# Patient Record
Sex: Male | Born: 1995 | Hispanic: No | Marital: Single | Smoking: Former smoker
Health system: Southern US, Community
[De-identification: ages and names within clinical notes are randomized; demographics above are authoritative.]

## PROBLEM LIST (undated history)

## (undated) DIAGNOSIS — I10 Essential (primary) hypertension: Secondary | ICD-10-CM

---
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
## 2015-01-28 ENCOUNTER — Encounter: Payer: Self-pay | Admitting: Emergency Medicine

## 2015-01-28 ENCOUNTER — Emergency Department
Admission: EM | Admit: 2015-01-28 | Discharge: 2015-01-28 | Disposition: A | Discharge status: Home / Self Care | Payer: Medicaid Other | Attending: Emergency Medicine | Admitting: Emergency Medicine

## 2015-01-28 DIAGNOSIS — I10 Essential (primary) hypertension: Secondary | ICD-10-CM | POA: Insufficient documentation

## 2015-01-28 DIAGNOSIS — R030 Elevated blood-pressure reading, without diagnosis of hypertension: Secondary | ICD-10-CM | POA: Diagnosis present

## 2015-01-28 MED ORDER — HYDROCHLOROTHIAZIDE 25 MG PO TABS: 25.0000 mg | ORAL_TABLET | Freq: Every day | ORAL | Status: DC

## 2015-01-28 NOTE — ED Provider Notes (Signed)
[facility] Emergency Department Provider Note     Time seen: ----------------------------------------- 2:42 PM on 01/28/2015 -----------------------------------------    I have reviewed the triage vital signs and the nursing notes.   HISTORY  Chief Complaint Hypertension    HPI Kenneth Cook is a 19 y.o. male sent here by his work after he noticed that his blood pressure was elevated. Patient was instructed to come here for minutes or an evaluation. Patient states he has mild hypertension but has never taken any blood pressure medicine for. He does not have any other symptoms.   History reviewed. No pertinent past medical history.  There are no active problems to display for this patient.   History reviewed. No pertinent past surgical history.  Allergies Review of patient's allergies indicates no known allergies.  Social History Social History  Substance Use Topics  . Smoking status: Never Smoker   . Smokeless tobacco: None  . Alcohol Use: No    Review of Systems Constitutional: Negative for fever. Eyes: Negative for visual changes. ENT: Negative for sore throat. Cardiovascular: Negative for chest pain. Respiratory: Negative for shortness of breath. Gastrointestinal: Negative for abdominal pain, vomiting and diarrhea. Genitourinary: Negative for dysuria. Musculoskeletal: Negative for back pain. Skin: Negative for rash. Neurological: Negative for headaches, focal weakness or numbness.  10-point ROS otherwise negative.  ____________________________________________   PHYSICAL EXAM:  VITAL SIGNS: ED Triage Vitals  Enc Vitals Group     BP 01/28/15 1427 160/107 mmHg     Pulse Rate 01/28/15 1427 92     Resp 01/28/15 1427 20     Temp 01/28/15 1427 98.3 F (36.8 C)     Temp Source 01/28/15 1427 Oral     SpO2 01/28/15 1427 99 %     Weight 01/28/15 1427 265 lb (120.203 kg)     Height 01/28/15 1427  (1.854 m)     Head  Cir --      Peak Flow --      Pain Score --      Pain Loc --      Pain Edu? --      Excl. in GC? --     Constitutional: Alert and oriented. Well appearing and in no distress. Eyes: Conjunctivae are normal. PERRL. Normal extraocular movements. ENT   Head: Normocephalic and atraumatic.   Nose: No congestion/rhinnorhea.   Mouth/Throat: Mucous membranes are moist.   Neck: No stridor. Cardiovascular: Normal rate, regular rhythm. Normal and symmetric distal pulses are present in all extremities. No murmurs, rubs, or gallops. Respiratory: Normal respiratory effort without tachypnea nor retractions. Breath sounds are clear and equal bilaterally. No wheezes/rales/rhonchi. Gastrointestinal: Soft and nontender. No distention. No abdominal bruits.  Musculoskeletal: Nontender with normal range of motion in all extremities. No joint effusions.  No lower extremity tenderness nor edema. Neurologic:  Normal speech and language. No gross focal neurologic deficits are appreciated. Speech is normal. No gait instability. Skin:  Skin is warm, dry and intact. No rash noted. Psychiatric: Mood and affect are normal. Speech and behavior are normal. Patient exhibits appropriate insight and judgment. ____________________________________________  ED COURSE:  Pertinent labs & imaging results that were available during my care of the patient were reviewed by me and considered in my medical decision making (see chart for details). Patient with a symptomatically hypertension, advised that his blood pressures been elevated for years. ____________________________________________  FINAL ASSESSMENT AND PLAN  Hypertension  Plan: Patient with labs and imaging as dictated above. Patient be  started on HCTZ, is encouraged to have close follow-up with [facility] for reevaluation.   Emily Filbert, MD   Emily Filbert, MD 01/28/15 [phone]

## 2015-01-28 NOTE — ED Notes (Signed)
States he went to work screening.they noticed that his b/p was elevated. Was instructed to come here for meds or eval. States he has had mild htn but has never taken any meds. Denies any other sx's

## 2015-01-28 NOTE — ED Notes (Signed)
Patient sent from employee health/ODS due to elevated blood pressure and need for treatment prior to being able to gain employment. Patient denies any associated symptoms or complaints.

## 2015-01-28 NOTE — Discharge Instructions (Signed)

## 2015-06-14 ENCOUNTER — Encounter: Payer: Self-pay | Admitting: Emergency Medicine

## 2015-06-14 ENCOUNTER — Emergency Department
Admission: EM | Admit: 2015-06-14 | Discharge: 2015-06-14 | Disposition: A | Discharge status: Home / Self Care | Payer: Medicaid Other | Attending: Emergency Medicine | Admitting: Emergency Medicine

## 2015-06-14 ENCOUNTER — Emergency Department: Payer: Medicaid Other

## 2015-06-14 DIAGNOSIS — R112 Nausea with vomiting, unspecified: Secondary | ICD-10-CM | POA: Insufficient documentation

## 2015-06-14 DIAGNOSIS — I10 Essential (primary) hypertension: Secondary | ICD-10-CM | POA: Insufficient documentation

## 2015-06-14 DIAGNOSIS — R0981 Nasal congestion: Secondary | ICD-10-CM | POA: Insufficient documentation

## 2015-06-14 HISTORY — DX: Essential (primary) hypertension: I10

## 2015-06-14 LAB — URINALYSIS COMPLETE WITH MICROSCOPIC (ARMC ONLY)
Bilirubin Urine: NEGATIVE
GLUCOSE, UA: NEGATIVE mg/dL
Hgb urine dipstick: NEGATIVE
KETONES UR: NEGATIVE mg/dL
Leukocytes, UA: NEGATIVE
NITRITE: NEGATIVE
Protein, ur: NEGATIVE mg/dL
RBC / HPF: NONE SEEN RBC/hpf (ref 0–5)
SPECIFIC GRAVITY, URINE: 1.018 (ref 1.005–1.030)
WBC, UA: NONE SEEN WBC/hpf (ref 0–5)
pH: 7 (ref 5.0–8.0)

## 2015-06-14 LAB — BASIC METABOLIC PANEL
Anion gap: 10 (ref 5–15)
BUN: 12 mg/dL (ref 6–20)
CALCIUM: 9.6 mg/dL (ref 8.9–10.3)
CHLORIDE: 104 mmol/L (ref 101–111)
CO2: 26 mmol/L (ref 22–32)
CREATININE: 1.11 mg/dL (ref 0.61–1.24)
Glucose, Bld: 98 mg/dL (ref 65–99)
Potassium: 3.6 mmol/L (ref 3.5–5.1)
SODIUM: 140 mmol/L (ref 135–145)

## 2015-06-14 MED ORDER — LISINOPRIL-HYDROCHLOROTHIAZIDE 20-25 MG PO TABS: 1.0000 | ORAL_TABLET | Freq: Every day | ORAL | Status: DC

## 2015-06-14 MED ORDER — HYDROCHLOROTHIAZIDE 25 MG PO TABS
25.0000 mg | ORAL_TABLET | Freq: Every day | ORAL | Status: DC
  Administered 2015-06-14: 25 mg via ORAL
  Filled 2015-06-14: qty 1

## 2015-06-14 MED ORDER — LISINOPRIL 10 MG PO TABS
20.0000 mg | ORAL_TABLET | Freq: Once | ORAL | Status: AC
  Administered 2015-06-14: 20 mg via ORAL
  Filled 2015-06-14: qty 2

## 2015-06-14 NOTE — ED Notes (Signed)
Pt called for treatment room x2, no answer in the lobby. First nurse notified.

## 2015-06-14 NOTE — Discharge Instructions (Signed)
Hypertension Hypertension, commonly called high blood pressure, is when the force of blood pumping through your arteries is too strong. Your arteries are the blood vessels that carry blood from your heart throughout your body. A blood pressure reading consists of a higher number over a lower number, such as 110/72. The higher number (systolic) is the pressure inside your arteries when your heart pumps. The lower number (diastolic) is the pressure inside your arteries when your heart relaxes. Ideally you want your blood pressure below 120/80. Hypertension forces your heart to work harder to pump blood. Your arteries may become narrow or stiff. Having untreated or uncontrolled hypertension can cause heart attack, stroke, kidney disease, and other problems. RISK FACTORS Some risk factors for high blood pressure are controllable. Others are not.  Risk factors you cannot control include:   Race. You may be at higher risk if you are African American.  Age. Risk increases with age.  Gender. Men are at higher risk than women before age 45 years. After age 65, women are at higher risk than men. Risk factors you can control include:  Not getting enough exercise or physical activity.  Being overweight.  Getting too much fat, sugar, calories, or salt in your diet.  Drinking too much alcohol. SIGNS AND SYMPTOMS Hypertension does not usually cause signs or symptoms. Extremely high blood pressure (hypertensive crisis) may cause headache, anxiety, shortness of breath, and nosebleed. DIAGNOSIS To check if you have hypertension, your health care provider will measure your blood pressure while you are seated, with your arm held at the level of your heart. It should be measured at least twice using the same arm. Certain conditions can cause a difference in blood pressure between your right and left arms. A blood pressure reading that is higher than normal on one occasion does not mean that you need treatment. If  it is not clear whether you have high blood pressure, you may be asked to return on a different day to have your blood pressure checked again. Or, you may be asked to monitor your blood pressure at home for 1 or more weeks. TREATMENT Treating high blood pressure includes making lifestyle changes and possibly taking medicine. Living a healthy lifestyle can help lower high blood pressure. You may need to change some of your habits. Lifestyle changes may include:  Following the DASH diet. This diet is high in fruits, vegetables, and whole grains. It is low in salt, red meat, and added sugars.  Keep your sodium intake below 2,300 mg per day.  Getting at least 30-45 minutes of aerobic exercise at least 4 times per week.  Losing weight if necessary.  Not smoking.  Limiting alcoholic beverages.  Learning ways to reduce stress. Your health care provider may prescribe medicine if lifestyle changes are not enough to get your blood pressure under control, and if one of the following is true:  You are 18-59 years of age and your systolic blood pressure is above 140.  You are 60 years of age or older, and your systolic blood pressure is above 150.  Your diastolic blood pressure is above 90.  You have diabetes, and your systolic blood pressure is over 140 or your diastolic blood pressure is over 90.  You have kidney disease and your blood pressure is above 140/90.  You have heart disease and your blood pressure is above 140/90. Your personal target blood pressure may vary depending on your medical conditions, your age, and other factors. HOME CARE INSTRUCTIONS    Have your blood pressure rechecked as directed by your health care provider.   Take medicines only as directed by your health care provider. Follow the directions carefully. Blood pressure medicines must be taken as prescribed. The medicine does not work as well when you skip doses. Skipping doses also puts you at risk for  problems.  Do not smoke.   Monitor your blood pressure at home as directed by your health care provider. SEEK MEDICAL CARE IF:   You think you are having a reaction to medicines taken.  You have recurrent headaches or feel dizzy.  You have swelling in your ankles.  You have trouble with your vision. SEEK IMMEDIATE MEDICAL CARE IF:  You develop a severe headache or confusion.  You have unusual weakness, numbness, or feel faint.  You have severe chest or abdominal pain.  You vomit repeatedly.  You have trouble breathing. MAKE SURE YOU:   Understand these instructions.  Will watch your condition.  Will get help right away if you are not doing well or get worse.   This information is not intended to replace advice given to you by your health care provider. Make sure you discuss any questions you have with your health care provider.   Document Released: 05/10/2005 Document Revised: 09/24/2014 Document Reviewed: 03/02/2013 Elsevier Interactive Patient Education 2016 [employer]   Take blood pressure medicine as directed. Follow-up with a primary physician as soon as you are able. Return to the emergency room for any concerns.

## 2015-06-14 NOTE — ED Notes (Addendum)
Pt c/o bp being elevated his whole life.  Has prescriptions but has not been able to fill them r/t financial difficulty.  For past 10 days has been elevated and having headaches.  Has been feeling weak as well.  Has been fatigued.

## 2015-06-14 NOTE — ED Provider Notes (Signed)
[facility] Emergency Department Provider Note  ____________________________________________  Time seen: Approximately 6:37 PM  I have reviewed the triage vital signs and the nursing notes.   HISTORY  Chief Complaint Hypertension    HPI Kenneth Cook is a 20 y.o. male with history of hypertension who presents with headache. He has not taken blood pressure medicine in several days. He also reports nausea and vomiting last night after eating [place] food. He is not nauseated today. He denies excess salt. No alcohol. He has been taking cold medicine this week. No vision changes. Minimal congestion. No shortness of breath or chest pain.   Past Medical History  Diagnosis Date  . Hypertension     There are no active problems to display for this patient.   History reviewed. No pertinent past surgical history.  Current Outpatient Rx  Name  Route  Sig  Dispense  Refill  . lisinopril-hydrochlorothiazide (PRINZIDE,ZESTORETIC) 20-25 MG tablet   Oral   Take 1 tablet by mouth daily.   30 tablet   0     Allergies Review of patient's allergies indicates no known allergies.  History reviewed. No pertinent family history.  Social History Social History  Substance Use Topics  . Smoking status: Never Smoker   . Smokeless tobacco: None  . Alcohol Use: No    Review of Systems Constitutional: No fever/chills Eyes: No visual changes. ENT: No sore throat. Had hoarseness, now resolved. Cardiovascular: Denies chest pain. Respiratory: Denies shortness of breath. Gastrointestinal: No abdominal pain.    No diarrhea.  No constipation. Genitourinary: Negative for dysuria. Musculoskeletal: Negative for back pain. Skin: Negative for rash. Neurological: Negative for headaches, focal weakness or numbness. 10-point ROS otherwise negative.  ____________________________________________   PHYSICAL EXAM:  VITAL SIGNS: ED Triage Vitals  Enc Vitals Group      BP 06/14/15 1558 189/124 mmHg     Pulse Rate 06/14/15 1558 82     Resp 06/14/15 1558 16     Temp 06/14/15 1558 98.9 F (37.2 C)     Temp Source 06/14/15 1558 Oral     SpO2 06/14/15 1558 99 %     Weight 06/14/15 1558 260 lb (117.935 kg)     Height 06/14/15 1558  (1.854 m)     Head Cir --      Peak Flow --      Pain Score 06/14/15 1600 6     Pain Loc --      Pain Edu? --      Excl. in GC? --     Constitutional: Alert and oriented. Well appearing and in no acute distress. Eyes: Conjunctivae are normal.  EOMI. Ears:  Clear with normal landmarks. No erythema. Head: Atraumatic. Nose: No congestion/rhinnorhea. Mouth/Throat: Mucous membranes are moist.  Oropharynx non-erythematous. No lesions. Neck:  Supple.  No adenopathy.   Cardiovascular: Normal rate, regular rhythm. Grossly normal heart sounds.  Good peripheral circulation. Respiratory: Normal respiratory effort.  No retractions. Lungs CTAB. Musculoskeletal: Nml ROM of upper and lower extremity joints. Neurologic:  Normal speech and language. No gross focal neurologic deficits are appreciated. No gait instability. Skin:  Skin is warm, dry and intact. No rash noted. Psychiatric: Mood and affect are normal. Speech and behavior are normal.  ____________________________________________   LABS (all labs ordered are listed, but only abnormal results are displayed)  Labs Reviewed  URINALYSIS COMPLETEWITH MICROSCOPIC ([facility] ONLY) - Abnormal; Notable for the following:    Color, Urine YELLOW (*)    APPearance  CLOUDY (*)    Bacteria, UA RARE (*)    Squamous Epithelial / LPF 0-5 (*)    All other components within normal limits  BASIC METABOLIC PANEL  CBG MONITORING, ED   ____________________________________________  EKG  NSR ____________________________________________  RADIOLOGY   ____________________________________________   PROCEDURES  Procedure(s) performed: None  Critical Care performed:  No  ____________________________________________   INITIAL IMPRESSION / ASSESSMENT AND PLAN / ED COURSE  Pertinent labs & imaging results that were available during my care of the patient were reviewed by me and considered in my medical decision making (see chart for details).  20 year old with history of hypertension who presents with headache. He has had a recent URI for sticking call medicine earlier this week. He has not had his blood pressure medicine in several days. He does not have insurance and  does not have a regular physician. Had a long discussion about the importance of blood pressure management, and obtaining a regular physician. He responded positively. He is given lisinopril 20 mg and HCTZ 25 mg today in the ER. I will give him 1 month's prescription of these 2 medications in encouraged close follow-up with a physician. He plans to contact [facility]. ____________________________________________   FINAL CLINICAL IMPRESSION(S) / ED DIAGNOSES  Final diagnoses:  Essential hypertension      Ignacia Bayley, PA-C 06/14/15 2009  Arnaldo Natal, MD 06/14/15 [phone]

## 2016-04-06 ENCOUNTER — Emergency Department
Admission: EM | Admit: 2016-04-06 | Discharge: 2016-04-06 | Disposition: A | Discharge status: Home / Self Care | Payer: Medicaid Other | Attending: Emergency Medicine | Admitting: Emergency Medicine

## 2016-04-06 DIAGNOSIS — R69 Illness, unspecified: Secondary | ICD-10-CM

## 2016-04-06 DIAGNOSIS — K529 Noninfective gastroenteritis and colitis, unspecified: Secondary | ICD-10-CM

## 2016-04-06 DIAGNOSIS — J111 Influenza due to unidentified influenza virus with other respiratory manifestations: Secondary | ICD-10-CM | POA: Insufficient documentation

## 2016-04-06 DIAGNOSIS — I159 Secondary hypertension, unspecified: Secondary | ICD-10-CM | POA: Insufficient documentation

## 2016-04-06 DIAGNOSIS — F17210 Nicotine dependence, cigarettes, uncomplicated: Secondary | ICD-10-CM | POA: Insufficient documentation

## 2016-04-06 LAB — COMPREHENSIVE METABOLIC PANEL
ALBUMIN: 4.8 g/dL (ref 3.5–5.0)
ALT: 34 U/L (ref 17–63)
AST: 27 U/L (ref 15–41)
Alkaline Phosphatase: 56 U/L (ref 38–126)
Anion gap: 8 (ref 5–15)
BILIRUBIN TOTAL: 1.3 mg/dL — AB (ref 0.3–1.2)
BUN: 10 mg/dL (ref 6–20)
CHLORIDE: 102 mmol/L (ref 101–111)
CO2: 29 mmol/L (ref 22–32)
Calcium: 9.6 mg/dL (ref 8.9–10.3)
Creatinine, Ser: 1.12 mg/dL (ref 0.61–1.24)
GFR calc Af Amer: >60 mL/min (ref >60)
GFR calc non Af Amer: >60 mL/min (ref >60)
GLUCOSE: 109 mg/dL — AB (ref 65–99)
POTASSIUM: 4.1 mmol/L (ref 3.5–5.1)
Sodium: 139 mmol/L (ref 135–145)
Total Protein: 8.3 g/dL — ABNORMAL HIGH (ref 6.5–8.1)

## 2016-04-06 LAB — CBC
HEMATOCRIT: 52.9 % — AB (ref 40.0–52.0)
Hemoglobin: 17.9 g/dL (ref 13.0–18.0)
MCH: 28.2 pg (ref 26.0–34.0)
MCHC: 33.9 g/dL (ref 32.0–36.0)
MCV: 83.1 fL (ref 80.0–100.0)
Platelets: 236 10*3/uL (ref 150–440)
RBC: 6.36 MIL/uL — ABNORMAL HIGH (ref 4.40–5.90)
RDW: 13.2 % (ref 11.5–14.5)
WBC: 6.8 10*3/uL (ref 3.8–10.6)

## 2016-04-06 LAB — LIPASE, BLOOD: LIPASE: 25 U/L (ref 11–51)

## 2016-04-06 MED ORDER — HYDROCHLOROTHIAZIDE 25 MG PO TABS: 25.0000 mg | ORAL_TABLET | Freq: Every day | ORAL | 1 refills | Status: AC

## 2016-04-06 MED ORDER — PROMETHAZINE HCL 25 MG PO TABS: 25.0000 mg | ORAL_TABLET | Freq: Four times a day (QID) | ORAL | 0 refills | Status: AC | PRN

## 2016-04-06 MED ORDER — AMLODIPINE BESYLATE 5 MG PO TABS: 5.0000 mg | ORAL_TABLET | Freq: Every day | ORAL | 1 refills | Status: DC

## 2016-04-06 MED ORDER — RANITIDINE HCL 150 MG PO CAPS: 150.0000 mg | ORAL_CAPSULE | Freq: Two times a day (BID) | ORAL | 0 refills | Status: AC

## 2016-04-06 NOTE — ED Notes (Signed)
Pt informed to return if any life threatening symptoms occur.  

## 2016-04-06 NOTE — ED Provider Notes (Signed)
[facility] Emergency Department Provider Note  ____________________________________________  Time seen: Approximately 5:38 PM  I have reviewed the triage vital signs and the nursing notes.   HISTORY  Chief Complaint Emesis; Diarrhea; and Headache    HPI Kenneth Cook is a 20 y.o. male who complains of nausea vomiting diarrhea and generalized abdominal pain as well as rhinorrhea and nonproductive cough and sore throat and fatigue and body aches. This is all been going on for about a week. It started after he spent time with his cousin who is sick with similar symptoms at a birthday party and at work.  Also reports that he's run out of his medication for hypertension due to lack of insurance and tight finances.     Past Medical History:  Diagnosis Date  . Hypertension      There are no active problems to display for this patient.    History reviewed. No pertinent surgical history.   Prior to Admission medications   Medication Sig Start Date End Date Taking? Authorizing Provider  lisinopril-hydrochlorothiazide (PRINZIDE,ZESTORETIC) 20-25 MG tablet Take 1 tablet by mouth daily. 06/14/15   Ignacia Bayleyobert Tumey, PA-C     Allergies Patient has no known allergies.   No family history on file.  Social History Social History  Substance Use Topics  . Smoking status: Current Every Day Smoker    Types: Cigarettes  . Smokeless tobacco: Never Used  . Alcohol use No    Review of Systems  Constitutional:   No fever or chills.  ENT:   Positive sore throat and rhinorrhea. Cardiovascular:   No chest pain. Respiratory:   No dyspnea positive nonproductive cough. Gastrointestinal:   Positive abdominal pain vomiting and diarrhea.   10-point ROS otherwise negative.  ____________________________________________   PHYSICAL EXAM:  VITAL SIGNS: ED Triage Vitals [04/06/16 1510]  Enc Vitals Group     BP (!) 186/119     Pulse Rate 92     Resp 18      Temp 99.2 F (37.3 C)     Temp Source Oral     SpO2 99 %     Weight 265 lb (120.2 kg)     Height 6\' 1"  (1.854 m)     Head Circumference      Peak Flow      Pain Score 9     Pain Loc      Pain Edu?      Excl. in GC?     Vital signs reviewed, nursing assessments reviewed.   Constitutional:   Alert and oriented. Well appearing and in no distress. Eyes:   No scleral icterus. No conjunctival pallor. PERRL. EOMI.  No nystagmus. ENT   Head:   Normocephalic and atraumatic.   Nose:   No congestion/rhinnorhea. No septal hematoma   Mouth/Throat:   MMM, mild pharyngeal erythema. No peritonsillar mass.    Neck:   No stridor. No SubQ emphysema. No meningismus. Hematological/Lymphatic/Immunilogical:   No cervical lymphadenopathy. Cardiovascular:   RRR. Symmetric bilateral radial and DP pulses.  No murmurs.  Respiratory:   Normal respiratory effort without tachypnea nor retractions. Breath sounds are clear and equal bilaterally. No wheezes/rales/rhonchi. Gastrointestinal:   Soft and nontender. Non distended. There is no CVA tenderness.  No rebound, rigidity, or guarding. Genitourinary:   deferred Musculoskeletal:   Nontender with normal range of motion in all extremities. No joint effusions.  No lower extremity tenderness.  No edema. Neurologic:   Normal speech and language.  CN 2-10  normal. Motor grossly intact. No gross focal neurologic deficits are appreciated.  Skin:    Skin is warm, dry and intact. No rash noted.  No petechiae, purpura, or bullae.  ____________________________________________    LABS (pertinent positives/negatives) (all labs ordered are listed, but only abnormal results are displayed) Labs Reviewed  COMPREHENSIVE METABOLIC PANEL - Abnormal; Notable for the following:       Result Value   Glucose, Bld 109 (*)    Total Protein 8.3 (*)    Total Bilirubin 1.3 (*)    All other components within normal limits  CBC - Abnormal; Notable for the following:     RBC 6.36 (*)    HCT 52.9 (*)    All other components within normal limits  LIPASE, BLOOD  URINALYSIS COMPLETEWITH MICROSCOPIC ([facility] ONLY)   ____________________________________________   EKG    ____________________________________________    RADIOLOGY    ____________________________________________   PROCEDURES Procedures  ____________________________________________   INITIAL IMPRESSION / ASSESSMENT AND PLAN / ED COURSE  Pertinent labs & imaging results that were available during my care of the patient were reviewed by me and considered in my medical decision making (see chart for details).  Patient well appearing no acute distress. Presents with uncontrolled hypertension as well as influenza-like illness with gastritis and URI symptoms.Considering the patient's symptoms, medical history, and physical examination today, I have low suspicion for cholecystitis or biliary pathology, pancreatitis, perforation or bowel obstruction, hernia, intra-abdominal abscess, AAA or dissection, volvulus or intussusception, mesenteric ischemia, or appendicitis.  No evidence of RPA or PTA. Treat the patient with antihypertensives. I printed out attainable online coupons to make these portable for 3 or $4 each. Also provide some symptomatic management for his viral illness.     Clinical Course    ____________________________________________   FINAL CLINICAL IMPRESSION(S) / ED DIAGNOSES  Final diagnoses:  Influenza-like illness  Gastroenteritis  Secondary hypertension       Portions of this note were generated with dragon dictation software. Dictation errors may occur despite best attempts at proofreading.    Sharman CheekPhillip Keean Wilmeth, MD 04/06/16 [phone]

## 2016-04-06 NOTE — ED Notes (Signed)
Pt A/O x 4, resp even and unlabored.  Pt c/o fatigue x 1 week, n/v w/ +PO intake, runny nose and cough.

## 2016-04-06 NOTE — ED Triage Notes (Signed)
Pt c/o HA with sinus congestion, N/V/D for the past week..Marland Kitchen

## 2017-12-24 ENCOUNTER — Other Ambulatory Visit: Payer: Self-pay

## 2017-12-24 ENCOUNTER — Encounter: Payer: Self-pay | Admitting: Radiology

## 2017-12-24 ENCOUNTER — Emergency Department: Payer: Self-pay

## 2017-12-24 ENCOUNTER — Emergency Department
Admission: EM | Admit: 2017-12-24 | Discharge: 2017-12-24 | Disposition: A | Discharge status: Home / Self Care | Payer: Self-pay | Attending: Emergency Medicine | Admitting: Emergency Medicine

## 2017-12-24 DIAGNOSIS — K610 Anal abscess: Secondary | ICD-10-CM

## 2017-12-24 DIAGNOSIS — K612 Anorectal abscess: Secondary | ICD-10-CM | POA: Insufficient documentation

## 2017-12-24 DIAGNOSIS — I10 Essential (primary) hypertension: Secondary | ICD-10-CM | POA: Insufficient documentation

## 2017-12-24 DIAGNOSIS — F17210 Nicotine dependence, cigarettes, uncomplicated: Secondary | ICD-10-CM | POA: Insufficient documentation

## 2017-12-24 DIAGNOSIS — Z79899 Other long term (current) drug therapy: Secondary | ICD-10-CM | POA: Insufficient documentation

## 2017-12-24 LAB — CBC WITH DIFFERENTIAL/PLATELET
Basophils Absolute: 0 10*3/uL (ref 0–0.1)
Basophils Relative: 0 %
Eosinophils Absolute: 0.1 10*3/uL (ref 0–0.7)
Eosinophils Relative: 1 %
HCT: 43.7 % (ref 40.0–52.0)
Hemoglobin: 15.3 g/dL (ref 13.0–18.0)
Lymphocytes Relative: 26 %
Lymphs Abs: 2.8 10*3/uL (ref 1.0–3.6)
MCH: 28.9 pg (ref 26.0–34.0)
MCHC: 34.9 g/dL (ref 32.0–36.0)
MCV: 83 fL (ref 80.0–100.0)
Monocytes Absolute: 0.7 10*3/uL (ref 0.2–1.0)
Monocytes Relative: 6 %
Neutro Abs: 7.2 10*3/uL — ABNORMAL HIGH (ref 1.4–6.5)
Neutrophils Relative %: 67 %
Platelets: 349 10*3/uL (ref 150–440)
RBC: 5.27 MIL/uL (ref 4.40–5.90)
RDW: 14.2 % (ref 11.5–14.5)
WBC: 10.8 10*3/uL — ABNORMAL HIGH (ref 3.8–10.6)

## 2017-12-24 LAB — COMPREHENSIVE METABOLIC PANEL
ALT: 21 U/L (ref 0–44)
AST: 21 U/L (ref 15–41)
Albumin: 4.1 g/dL (ref 3.5–5.0)
Alkaline Phosphatase: 80 U/L (ref 38–126)
Anion gap: 9 (ref 5–15)
BUN: 17 mg/dL (ref 6–20)
CO2: 27 mmol/L (ref 22–32)
Calcium: 9.1 mg/dL (ref 8.9–10.3)
Chloride: 106 mmol/L (ref 98–111)
Creatinine, Ser: 1.07 mg/dL (ref 0.61–1.24)
GFR calc Af Amer: >60 mL/min (ref >60)
GFR calc non Af Amer: >60 mL/min (ref >60)
Glucose, Bld: 102 mg/dL — ABNORMAL HIGH (ref 70–99)
Potassium: 3.4 mmol/L — ABNORMAL LOW (ref 3.5–5.1)
Sodium: 142 mmol/L (ref 135–145)
Total Bilirubin: 0.5 mg/dL (ref 0.3–1.2)
Total Protein: 7.9 g/dL (ref 6.5–8.1)

## 2017-12-24 MED ORDER — CLINDAMYCIN HCL 300 MG PO CAPS: 300.0000 mg | ORAL_CAPSULE | Freq: Three times a day (TID) | ORAL | 0 refills | Status: AC

## 2017-12-24 MED ORDER — LIDOCAINE HCL (PF) 1 % IJ SOLN
INTRAMUSCULAR | Status: AC
  Filled 2017-12-24: qty 5

## 2017-12-24 MED ORDER — HYDROCODONE-ACETAMINOPHEN 5-325 MG PO TABS
1.0000 | ORAL_TABLET | Freq: Once | ORAL | Status: AC
  Administered 2017-12-24: 1 via ORAL
  Filled 2017-12-24: qty 1

## 2017-12-24 MED ORDER — CLINDAMYCIN HCL 150 MG PO CAPS
300.0000 mg | ORAL_CAPSULE | Freq: Once | ORAL | Status: AC
  Administered 2017-12-24: 300 mg via ORAL
  Filled 2017-12-24: qty 2

## 2017-12-24 MED ORDER — LISINOPRIL-HYDROCHLOROTHIAZIDE 20-25 MG PO TABS: 1.0000 | ORAL_TABLET | Freq: Every day | ORAL | 0 refills | Status: DC

## 2017-12-24 MED ORDER — HYDROCODONE-ACETAMINOPHEN 5-325 MG PO TABS: 1.0000 | ORAL_TABLET | Freq: Four times a day (QID) | ORAL | 0 refills | Status: AC | PRN

## 2017-12-24 MED ORDER — LIDOCAINE HCL 1 % IJ SOLN
5.0000 mL | Freq: Once | INTRAMUSCULAR | Status: DC
  Filled 2017-12-24: qty 5

## 2017-12-24 MED ORDER — IOHEXOL 300 MG/ML  SOLN
100.0000 mL | Freq: Once | INTRAMUSCULAR | Status: AC | PRN
  Administered 2017-12-24: 100 mL via INTRAVENOUS
  Filled 2017-12-24: qty 100

## 2017-12-24 NOTE — ED Provider Notes (Signed)
[facility] Emergency Department Provider Note  ____________________________________________  Time seen: Approximately 4:36 PM  I have reviewed the triage vital signs and the nursing notes.   HISTORY  Chief Complaint Abscess    HPI Kenneth Cook is a 22 y.o. male presents to the emergency department with an abscess at the 4 and 5 o'clock position between of the anus for the past week.  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  Patient has not been under the care of general surgery.  He has experienced symptoms for approximately 1 week and has had chills and low-grade fever.  No nausea or vomiting.  No alleviating measures have been attempted.   Past Medical History:  Diagnosis Date  . Hypertension     There are no active problems to display for this patient.   No past surgical history on file.  Prior to Admission medications   Medication Sig Start Date End Date Taking? Authorizing Provider  amLODipine (NORVASC) 5 MG tablet Take 1 tablet (5 mg total) by mouth daily. 04/06/16   Sharman Cheek, MD  clindamycin (CLEOCIN) 300 MG capsule Take 1 capsule (300 mg total) by mouth 3 (three) times daily for 10 days. 12/24/17 01/03/18  Orvil Feil, PA-C  hydrochlorothiazide (HYDRODIURIL) 25 MG tablet Take 1 tablet (25 mg total) by mouth daily. 04/06/16   Sharman Cheek, MD  HYDROcodone-acetaminophen (NORCO) 5-325 MG tablet Take 1 tablet by mouth every 6 (six) hours as needed for up to 3 days for moderate pain. 12/24/17 12/27/17  Orvil Feil, PA-C  lisinopril-hydrochlorothiazide (PRINZIDE,ZESTORETIC) 20-25 MG tablet Take 1 tablet by mouth daily. 06/14/15   Ignacia Bayley, PA-C  promethazine (PHENERGAN) 25 MG tablet Take 1 tablet (25 mg total) by mouth every 6 (six) hours as needed for nausea or vomiting. 04/06/16   Sharman Cheek, MD  ranitidine (ZANTAC) 150 MG capsule Take 1 capsule (150 mg total) by mouth 2 (two)  times daily. 04/06/16   Sharman Cheek, MD    Allergies Patient has no known allergies.  No family history on file.  Social History Social History   Tobacco Use  . Smoking status: Current Every Day Smoker    Types: Cigarettes  . Smokeless tobacco: Never Used  Substance Use Topics  . Alcohol use: No  . Drug use: Yes     Review of Systems  Constitutional: No fever/chills Eyes: No visual changes. No discharge ENT: No upper respiratory complaints. Cardiovascular: no chest pain. Respiratory: no cough. No SOB. Gastrointestinal: No abdominal pain.  No nausea, no vomiting.  No diarrhea.  No constipation.  Patient has anorectal abscess. Genitourinary: Negative for dysuria. No hematuria Musculoskeletal: Negative for musculoskeletal pain. Skin: Negative for rash, abrasions, lacerations, ecchymosis. Neurological: Negative for headaches, focal weakness or numbness.   ____________________________________________   PHYSICAL EXAM:  VITAL SIGNS: ED Triage Vitals [12/24/17 1612]  Enc Vitals Group     BP (!) 149/94     Pulse Rate 80     Resp 18     Temp 99.1 F (37.3 C)     Temp Source Oral     SpO2 100 %     Weight 230 lb (104.3 kg)     Height 6\' 1"  (1.854 m)     Head Circumference      Peak Flow      Pain Score 8     Pain Loc      Pain Edu?  Excl. in GC?      Constitutional: Alert and oriented.  Patient appears uncomfortable. Eyes: Conjunctivae are normal. PERRL. EOMI. Head: Atraumatic. Cardiovascular: Normal rate, regular rhythm. Normal S1 and S2.  Good peripheral circulation. Respiratory: Normal respiratory effort without tachypnea or retractions. Lungs CTAB. Good air entry to the bases with no decreased or absent breath sounds. Gastrointestinal: Bowel sounds 4 quadrants. Soft and nontender to palpation. No guarding or rigidity. No palpable masses. No distention. No CVA tenderness.  Patient has a 2 cm palpable abscess of the anus between the 4:00 and 5  o'clock position with surrounding induration and cellulitis. Musculoskeletal: Full range of motion to all extremities. No gross deformities appreciated. Neurologic:  Normal speech and language. No gross focal neurologic deficits are appreciated.  Skin:  Skin is warm, dry and intact. No rash noted. Psychiatric: Mood and affect are normal. Speech and behavior are normal. Patient exhibits appropriate insight and judgement.   ____________________________________________   LABS (all labs ordered are listed, but only abnormal results are displayed)  Labs Reviewed  CBC WITH DIFFERENTIAL/PLATELET - Abnormal; Notable for the following components:      Result Value   WBC 10.8 (*)    Neutro Abs 7.2 (*)    All other components within normal limits  COMPREHENSIVE METABOLIC PANEL - Abnormal; Notable for the following components:   Potassium 3.4 (*)    Glucose, Bld 102 (*)    All other components within normal limits   ____________________________________________  EKG   ____________________________________________  RADIOLOGY I personally viewed and evaluated these images as part of my medical decision making, as well as reviewing the written report by the radiologist.    Ct Pelvis W Contrast  Result Date: 12/24/2017 CLINICAL DATA:  Anorectal abscess, increased pain over last few days EXAM: CT PELVIS WITH CONTRAST TECHNIQUE: Multidetector CT imaging of the pelvis was performed using the standard protocol following the bolus administration of intravenous contrast. Sagittal and coronal MPR images reconstructed from axial data set. CONTRAST:  OMNIPAQUE IOHEXOL 300 MG/ML SOLN IV. No oral contrast. COMPARISON:  None FINDINGS: Urinary Tract:  Bladder and distal ureters normal appearance Bowel: Normal appendix. Small bowel loops unremarkable. Colon collapsed with suboptimal assessment of wall thickness. Suspect mild RIGHT lateral rectal wall thickening though this too could be an artifact related  underdistention. Tiny fluid collections in the intersphincteric space either representing fistulae or tiny abscess collections. Surrounding fat planes clear. Vascular/Lymphatic: Normal sized inguinal lymph nodes. No intrapelvic adenopathy. Vascular structures unremarkable. Reproductive:  Unremarkable Other: No free air or free fluid. No additional inflammatory changes. Musculoskeletal: Unremarkable IMPRESSION: Fluid attenuation foci within the intersphincteric space either representing fistulae or tiny abscess collections; if clinically indicated, extent of these can be better assessed by follow-up pelvic MR. Electronically Signed   By: Ulyses Southward M.D.   On: 12/24/2017 18:16    ____________________________________________    PROCEDURES  Procedure(s) performed:    Procedures  INCISION AND DRAINAGE Performed by: Orvil Feil Consent: Verbal consent obtained. Risks and benefits: risks, benefits and alternatives were discussed Type: abscess  Body area: Perianal  Anesthesia: local infiltration  Incision was made with a scalpel.  Local anesthetic: lidocaine 1% without epinephrine  Anesthetic total: 3 ml  Complexity: complex Blunt dissection to break up loculations  Drainage: purulent  Drainage amount: Copious  Packing material: None per Dr. Excell Seltzer  Patient tolerance: Patient tolerated the procedure well with no immediate complications.     Medications  lidocaine (XYLOCAINE) 1 % (with  pres) injection 5 mL (has no administration in time range)  iohexol (OMNIPAQUE) 300 MG/ML solution 100 mL (100 mLs Intravenous Contrast Given 12/24/17 1748)  clindamycin (CLEOCIN) capsule 300 mg (300 mg Oral Given 12/24/17 1907)  HYDROcodone-acetaminophen (NORCO/VICODIN) 5-325 MG per tablet 1 tablet (1 tablet Oral Given 12/24/17 1907)     ____________________________________________   INITIAL IMPRESSION / ASSESSMENT AND PLAN / ED COURSE  Pertinent labs & imaging results that were available  during my care of the patient were reviewed by me and considered in my medical decision making (see chart for details).  Review of the [place] CSRS was performed in accordance of the [facility] prior to dispensing any controlled drugs.  Clinical Course as of Dec 25 1906  Sat Dec 24, 2017  1851 HCT: 43.7 [JW]    Clinical Course User Index [JW] Pia MauWoods, Lemma Tetro M, [place]PA-C     Assessment and Plan:  Anorectal abscess Patient presents to the emergency department with an abscess of the anus between the 4:00 and 5 o'clock position that has been apparent for the past week.  CT pelvis was obtained in the emergency department to rule out rectal involvement and to assess for fistula formation.  CT pelvis results revealed intersphincteric fluid.  Dr. Excell Seltzerooper with general surgery was consulted and Dr. Excell Seltzerooper recommended bedside incision and drainage without packing in the emergency department.  Patient was discharged with clindamycin and Norco.  Patient was advised to follow-up with Dr. Excell Seltzerooper this week.  All patient questions were answered.     ____________________________________________  FINAL CLINICAL IMPRESSION(S) / ED DIAGNOSES  Final diagnoses:  Perianal abscess      NEW MEDICATIONS STARTED DURING THIS VISIT:  ED Discharge Orders        Ordered    clindamycin (CLEOCIN) 300 MG capsule  3 times daily     12/24/17 1853    HYDROcodone-acetaminophen (NORCO) 5-325 MG tablet  Every 6 hours PRN     12/24/17 1854          This chart was dictated using voice recognition software/Dragon. Despite best efforts to proofread, errors can occur which can change the meaning. Any change was purely unintentional.    Orvil FeilWoods, Vonzell Lindblad M, PA-C 12/24/17 1911    Sharman CheekStafford, Phillip, MD 12/27/17 2045

## 2017-12-24 NOTE — ED Triage Notes (Signed)
Patient reports "boil" reports this is 3rd in approximately 2 months.

## 2018-09-01 ENCOUNTER — Other Ambulatory Visit: Payer: Self-pay

## 2018-09-01 ENCOUNTER — Emergency Department (HOSPITAL_COMMUNITY): Payer: Self-pay

## 2018-09-01 ENCOUNTER — Emergency Department (HOSPITAL_COMMUNITY)
Admission: EM | Admit: 2018-09-01 | Discharge: 2018-09-01 | Disposition: A | Discharge status: Home / Self Care | Payer: Self-pay | Attending: Emergency Medicine | Admitting: Emergency Medicine

## 2018-09-01 ENCOUNTER — Encounter (HOSPITAL_COMMUNITY): Payer: Self-pay | Admitting: Emergency Medicine

## 2018-09-01 DIAGNOSIS — R0602 Shortness of breath: Secondary | ICD-10-CM | POA: Insufficient documentation

## 2018-09-01 DIAGNOSIS — F17210 Nicotine dependence, cigarettes, uncomplicated: Secondary | ICD-10-CM | POA: Insufficient documentation

## 2018-09-01 DIAGNOSIS — Z79899 Other long term (current) drug therapy: Secondary | ICD-10-CM | POA: Insufficient documentation

## 2018-09-01 DIAGNOSIS — I10 Essential (primary) hypertension: Secondary | ICD-10-CM | POA: Insufficient documentation

## 2018-09-01 DIAGNOSIS — T7840XA Allergy, unspecified, initial encounter: Secondary | ICD-10-CM

## 2018-09-01 DIAGNOSIS — R0981 Nasal congestion: Secondary | ICD-10-CM | POA: Insufficient documentation

## 2018-09-01 DIAGNOSIS — J3489 Other specified disorders of nose and nasal sinuses: Secondary | ICD-10-CM | POA: Insufficient documentation

## 2018-09-01 LAB — CBC
HCT: 48.5 % (ref 39.0–52.0)
Hemoglobin: 15.8 g/dL (ref 13.0–17.0)
MCH: 28 pg (ref 26.0–34.0)
MCHC: 32.6 g/dL (ref 30.0–36.0)
MCV: 85.8 fL (ref 80.0–100.0)
Platelets: 259 10*3/uL (ref 150–400)
RBC: 5.65 MIL/uL (ref 4.22–5.81)
RDW: 12.8 % (ref 11.5–15.5)
WBC: 7.5 10*3/uL (ref 4.0–10.5)
nRBC: 0 % (ref 0.0–0.2)

## 2018-09-01 LAB — BASIC METABOLIC PANEL
Anion gap: 11 (ref 5–15)
BUN: 12 mg/dL (ref 6–20)
CO2: 24 mmol/L (ref 22–32)
Calcium: 9.3 mg/dL (ref 8.9–10.3)
Chloride: 103 mmol/L (ref 98–111)
Creatinine, Ser: 1.12 mg/dL (ref 0.61–1.24)
GFR calc Af Amer: >60 mL/min (ref >60)
GFR calc non Af Amer: >60 mL/min (ref >60)
Glucose, Bld: 98 mg/dL (ref 70–99)
Potassium: 4.3 mmol/L (ref 3.5–5.1)
Sodium: 138 mmol/L (ref 135–145)

## 2018-09-01 LAB — TROPONIN I: Troponin I: <0.03 ng/mL (ref <0.03)

## 2018-09-01 MED ORDER — LORATADINE 10 MG PO TABS: 10.0000 mg | ORAL_TABLET | Freq: Every day | ORAL | 0 refills | Status: AC

## 2018-09-01 MED ORDER — SODIUM CHLORIDE 0.9% FLUSH: 3.0000 mL | Freq: Once | INTRAVENOUS | Status: DC

## 2018-09-01 MED ORDER — ALBUTEROL SULFATE HFA 108 (90 BASE) MCG/ACT IN AERS
4.0000 | INHALATION_SPRAY | Freq: Once | RESPIRATORY_TRACT | Status: AC
  Administered 2018-09-01: 20:00:00 4 via RESPIRATORY_TRACT
  Filled 2018-09-01: qty 6.7

## 2018-09-01 MED ORDER — LISINOPRIL-HYDROCHLOROTHIAZIDE 20-25 MG PO TABS: 1.0000 | ORAL_TABLET | Freq: Every day | ORAL | 0 refills | Status: DC

## 2018-09-01 MED ORDER — FLUTICASONE PROPIONATE 50 MCG/ACT NA SUSP: 2.0000 | Freq: Every day | NASAL | 0 refills | Status: AC

## 2018-09-01 NOTE — Discharge Instructions (Addendum)
Please continue taking the albuterol inhaler twice daily for the next 7 days.  Please take Claritin and Flonase as directed as well.  Please make an appointment to follow-up with your regular doctor.  I provided a refill of your blood pressure medications however you will need to follow-up with your primary doctor for further medication refills.  Please return to the emergency department for any new or worsening symptoms.

## 2018-09-01 NOTE — ED Provider Notes (Addendum)
[facility] EMERGENCY DEPARTMENT Provider Note   CSN: [id number] Arrival date & time: 09/01/18  1755    History   Chief Complaint Chief Complaint  Patient presents with  . Shortness of Breath    HPI Kenneth Cook is a 23 y.o. male.     HPI  Pt is a 23 y/o male with a h/o HTN who presents to the ED today c/o shortness of breath that began 2 days ago. States that his symptoms are minimal and he has been able to continue his normal daily activities without difficulty. 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 He states that his symptoms have improved and his does not feel sob speaking with me during the history.   He states he has had allergy symptoms and has had rhinorrhea, congestion. No cough or hemoptysis. No chest pain or pain with inspiration. He has tried taking OTC allergy medications which have improved his symptoms somewhat.   Denies leg pain/swelling, hemoptysis, recent surgery/trauma, recent long travel, hormone use, personal hx of cancer, or hx of DVT/PE.    Past Medical History:  Diagnosis Date  . Hypertension     There are no active problems to display for this patient.   History reviewed. No pertinent surgical history.     Home Medications    Prior to Admission medications   Medication Sig Start Date End Date Taking? Authorizing Provider  amLODipine (NORVASC) 5 MG tablet Take 1 tablet (5 mg total) by mouth daily. 04/06/16   Sharman Cheek, MD  fluticasone [facility]) 50 MCG/ACT nasal spray Place 2 sprays into both nostrils daily. 09/01/18   Joren Rehm S, PA-C  hydrochlorothiazide (HYDRODIURIL) 25 MG tablet Take 1 tablet (25 mg total) by mouth daily. 04/06/16   Sharman Cheek, MD  lisinopril-hydrochlorothiazide (PRINZIDE,ZESTORETIC) 20-25 MG tablet Take 1 tablet by mouth daily for 14 days. 09/01/18 09/15/18  Dajuan Turnley S, PA-C  loratadine (CLARITIN) 10 MG  tablet Take 1 tablet (10 mg total) by mouth daily for 30 days. 09/01/18 10/01/18  Kaidan Harpster S, PA-C  promethazine (PHENERGAN) 25 MG tablet Take 1 tablet (25 mg total) by mouth every 6 (six) hours as needed for nausea or vomiting. 04/06/16   Sharman Cheek, MD  ranitidine (ZANTAC) 150 MG capsule Take 1 capsule (150 mg total) by mouth 2 (two) times daily. 04/06/16   Sharman Cheek, MD    Family History No family history on file.  Social History Social History   Tobacco Use  . Smoking status: Current Every Day Smoker    Packs/day: 0.50    Types: Cigarettes  . Smokeless tobacco: Never Used  Substance Use Topics  . Alcohol use: No  . Drug use: Not Currently     Allergies   Patient has no known allergies.   Review of Systems Review of Systems  Constitutional: Negative for fever.  HENT: Positive for congestion and rhinorrhea. Negative for sore throat.   Eyes: Negative for visual disturbance.  Respiratory: Positive for shortness of breath. Negative for cough.   Cardiovascular: Negative for chest pain and leg swelling.  Gastrointestinal: Negative for abdominal pain, constipation, diarrhea, nausea and vomiting.  Genitourinary: Negative for flank pain.  Musculoskeletal: Negative for myalgias.  Skin: Negative for rash.  Neurological: Negative for headaches.     Physical Exam Updated Vital Signs BP (!) 153/102   Pulse 60   Temp 98.3 F (36.8 C) (Oral)  Resp 16   SpO2 97%   Physical Exam Vitals signs and nursing note reviewed.  Constitutional:      General: He is not in acute distress.    Appearance: He is well-developed. He is not ill-appearing or toxic-appearing.  HENT:     Head: Normocephalic and atraumatic.  Eyes:     Conjunctiva/sclera: Conjunctivae normal.  Neck:     Musculoskeletal: Neck supple.  Cardiovascular:     Rate and Rhythm: Normal rate and regular rhythm.     Pulses: Normal pulses.     Heart sounds: Normal heart sounds. No murmur.   Pulmonary:     Effort: Pulmonary effort is normal. No respiratory distress.     Breath sounds: Normal breath sounds. No decreased breath sounds, wheezing, rhonchi or rales.     Comments: Able to speak in full paragraphs without tachypnea or obvious dyspnea.  Satting 100% on room air throughout my evaluation.  Respiratory rate 15 on monitor. Abdominal:     Palpations: Abdomen is soft.     Tenderness: There is no abdominal tenderness.  Musculoskeletal:     Right lower leg: He exhibits no tenderness. No edema.     Left lower leg: He exhibits no tenderness. No edema.  Skin:    General: Skin is warm and dry.  Neurological:     Mental Status: He is alert.     ED Treatments / Results  Labs (all labs ordered are listed, but only abnormal results are displayed) Labs Reviewed  BASIC METABOLIC PANEL  CBC  TROPONIN I    EKG None  Radiology Dg Chest 2 View  Result Date: 09/01/2018 CLINICAL DATA:  Shortness of breath. EXAM: CHEST - 2 VIEW COMPARISON:  None. FINDINGS: The cardiomediastinal contours are normal. Central bronchial thickening. Pulmonary vasculature is normal. No consolidation, pleural effusion, or pneumothorax. No acute osseous abnormalities are seen. IMPRESSION: Central bronchial thickening suggesting bronchitis or asthma. Electronically Signed   By: Narda Rutherford M.D.   On: 09/01/2018 20:27    Procedures Procedures (including critical care time)  Medications Ordered in ED Medications  albuterol (PROVENTIL HFA;VENTOLIN HFA) 108 (90 Base) MCG/ACT inhaler 4 puff (4 puffs Inhalation Given 09/01/18 1949)     Initial Impression / Assessment and Plan / ED Course  I have reviewed the triage vital signs and the nursing notes.  Pertinent labs & imaging results that were available during my care of the patient were reviewed by me and considered in my medical decision making (see chart for details).     Final Clinical Impressions(s) / ED Diagnoses   Final diagnoses:   Shortness of breath  Allergic state, initial encounter   23 year old male with no past medical history presenting emergency department today complaining of shortness of breath and allergy symptoms.  No cough, fevers, chest pain or pain with inspiration.  No risk factors for PE/DVT.  PERC negative.  Lungs are clear to auscultation bilaterally.  Patient in no distress and has no obvious dyspnea or tachypnea on my exam.  Heart with regular rate and rhythm.  CBC, BMP and troponin are all negative.  No evidence of anemia or leukocytosis.  Electrolytes are within normal limits.  His EKG shows sinus rhythm with sinus arrhythmia.  No ischemic changes.  Chest x-ray shows changes concerning for right Titus versus asthma.. He was given albuterol inhaler and symptoms and he feels improved.  Patient was able to ambulate in the ED and maintain sats at 97% and above.  No dyspnea with  ambulation.  I doubt acute etiology of his symptoms today and feel that he is appropriate for outpatient follow-up of his symptoms.  He is requesting a refill of his blood pressure medication.  I will give him Rx for this.  Have given him follow-up with PCP and return the ER for new or worsening symptoms.  Voices understanding the plan and reasons return for all questions answered.  Patient stable at discharge.  Kenneth Alasristan Lemont Cook was evaluated in Emergency Department on 09/02/2018 for the symptoms described in the history of present illness. He was evaluated in the context of the global COVID-19 pandemic, which necessitated consideration that the patient might be at risk for infection with the SARS-CoV-2 virus that causes COVID-19. Institutional protocols and algorithms that pertain to the evaluation of patients at risk for COVID-19 are in a state of rapid change based on information released by regulatory bodies including the CDC and federal and state organizations. These policies and algorithms were followed during the patient's care in the  ED. I have low suspicion for COVID and the patient without fevers or cough.  He does not require testing at this time and does not meet criteria for testing based on her current hospital recommendations.  ED Discharge Orders         Ordered    lisinopril-hydrochlorothiazide (PRINZIDE,ZESTORETIC) 20-25 MG tablet  Daily     09/01/18 2058    loratadine (CLARITIN) 10 MG tablet  Daily     09/01/18 2058    fluticasone (FLONASE) 50 MCG/ACT nasal spray  Daily     09/01/18 2058           Karrie MeresCouture, Anaysha Andre S, PA-C 09/02/18 0025    Lubertha Leite S, PA-C 09/02/18 0026    Tilden Fossaees, Elizabeth, MD 09/02/18 1114

## 2018-09-01 NOTE — ED Notes (Signed)
Pt ambulated without diffiuclty, Spo2 97%

## 2018-09-01 NOTE — ED Triage Notes (Signed)
Patient reports shortness of breath and allergy symptoms such as runny nose, itchy eyes since Wednesday. He states he wouldn't have come in but the shortness of breath got worse, making him winded after even talking. Resp e/u at rest, skin w/d. Denies fevers or cough. No recent travel or sick contacts.

## 2019-01-17 IMAGING — CT CT PELVIS W/ CM
2 of 3 series · 16 of 46 positions shown, 18 images · IV contrast (omnipaque)
Comparison: None

CLINICAL DATA: Anorectal abscess, increased pain over last few days

EXAM:
CT PELVIS WITH CONTRAST
TECHNIQUE: Multidetector CT imaging of the pelvis was performed using the
standard protocol following the bolus administration of intravenous
contrast. Sagittal and coronal MPR images reconstructed from axial
data set.
CONTRAST:  100mL OMNIPAQUE IOHEXOL 300 MG/ML SOLN IV. No oral
contrast.

[Series 2: routine abd/pel with · axial · 0.89mm/px · z∈[-494,-224]mm · 13 of 64 slices shown, 15 images]
[im 5/64  soft-tissue]
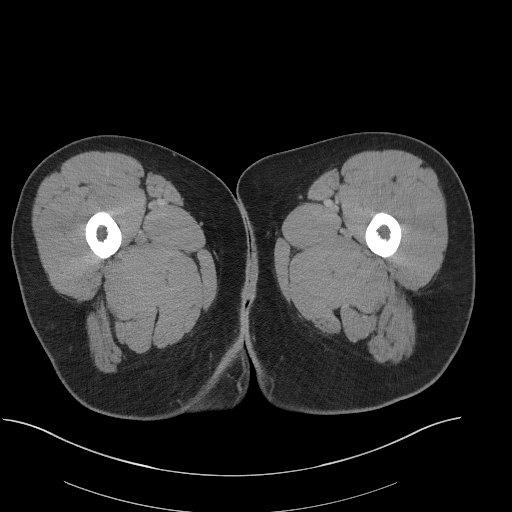
[im 5/64  bone]
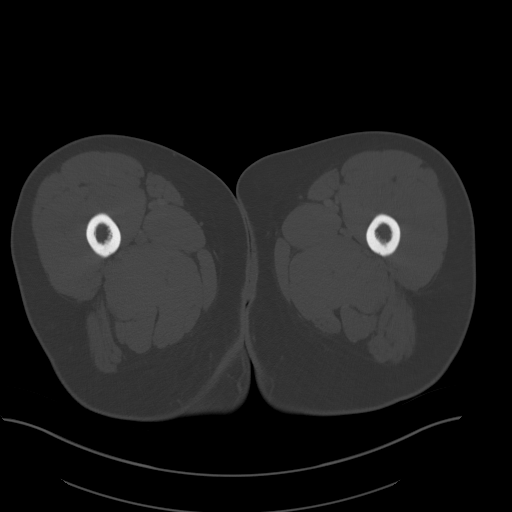
[im 9/64  soft-tissue]
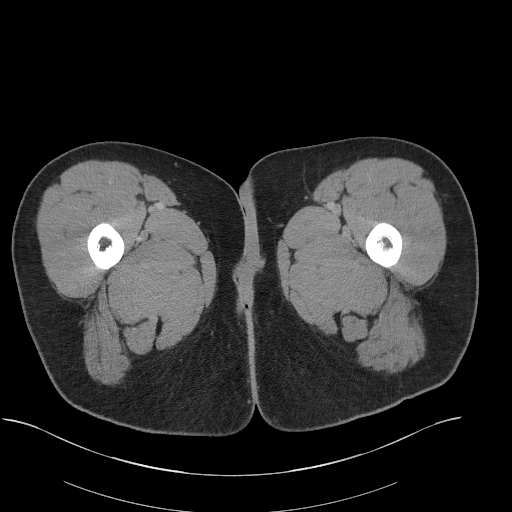
[im 13/64  soft-tissue]
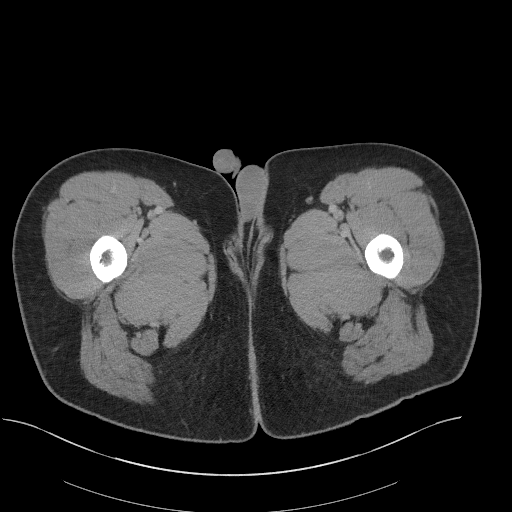
[im 19/64  soft-tissue]
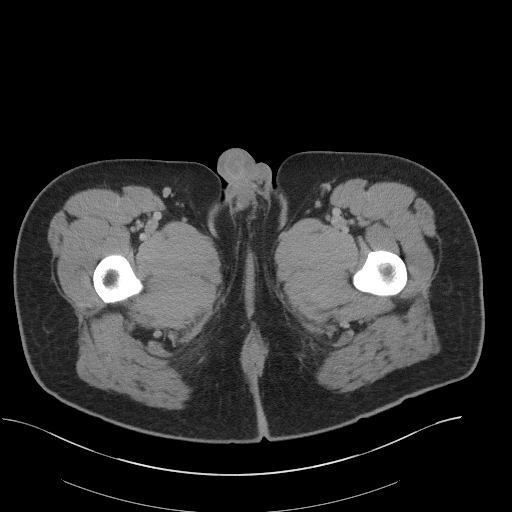
[im 23/64  soft-tissue]
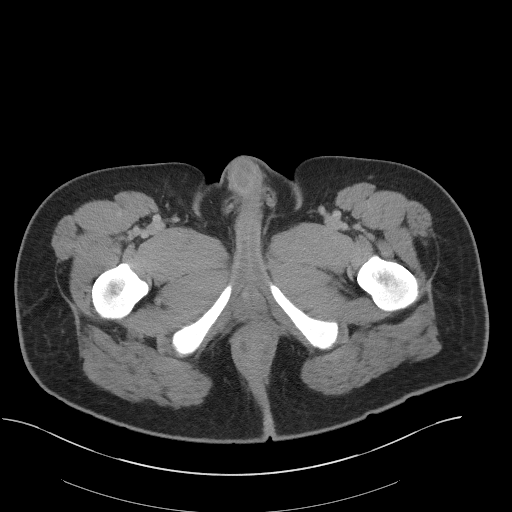
[im 27/64  soft-tissue]
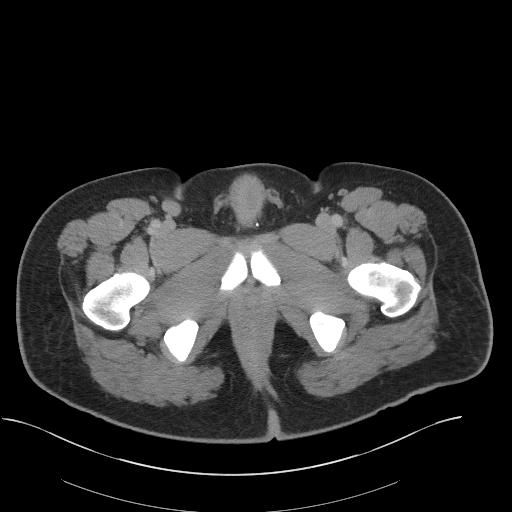
[im 33/64  soft-tissue]
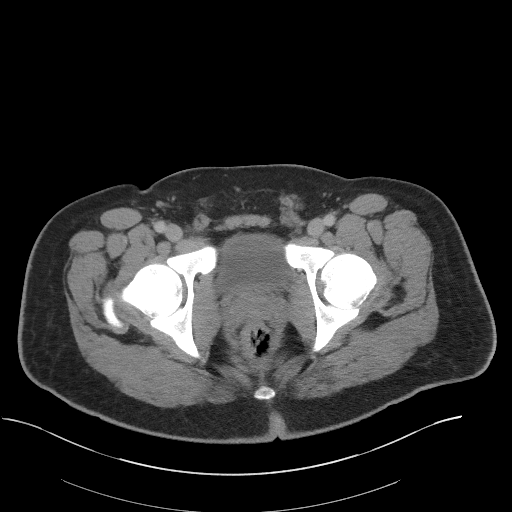
[im 37/64  soft-tissue]
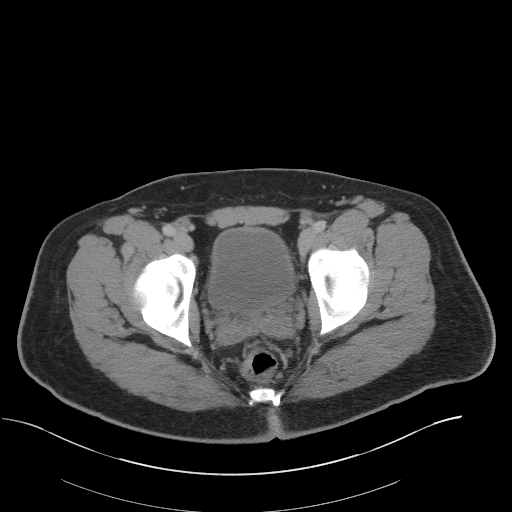
[im 41/64  soft-tissue]
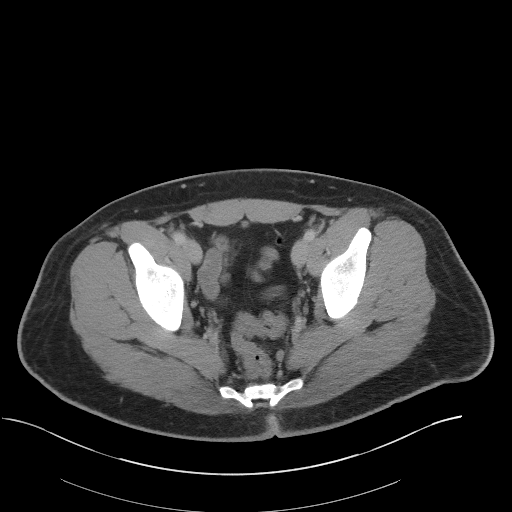
[im 41/64  bone]
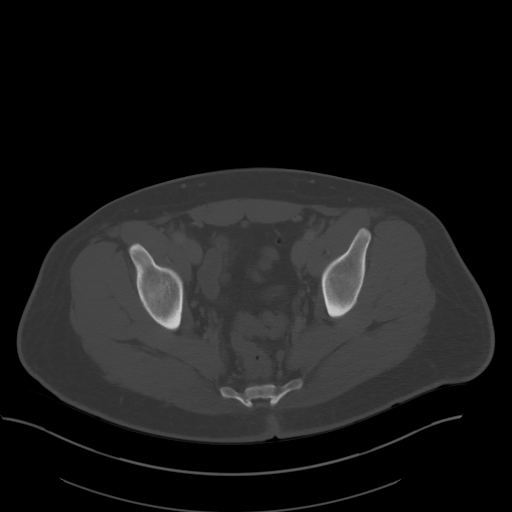
[im 45/64  soft-tissue]
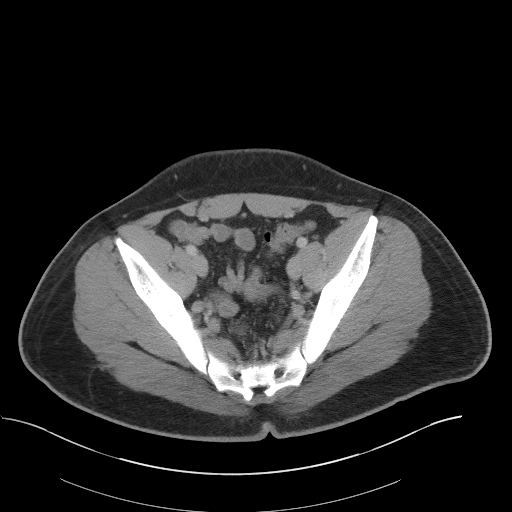
[im 51/64  soft-tissue]
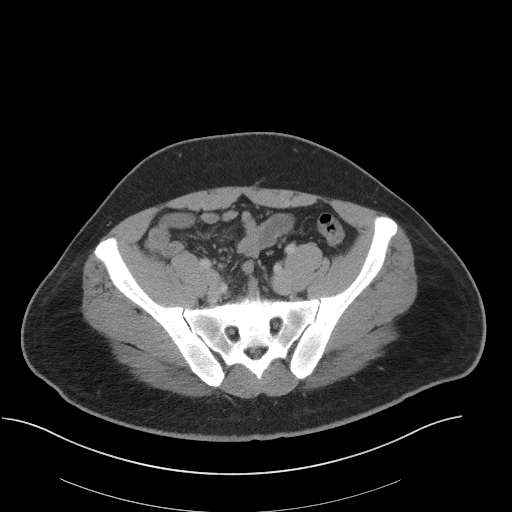
[im 55/64  soft-tissue]
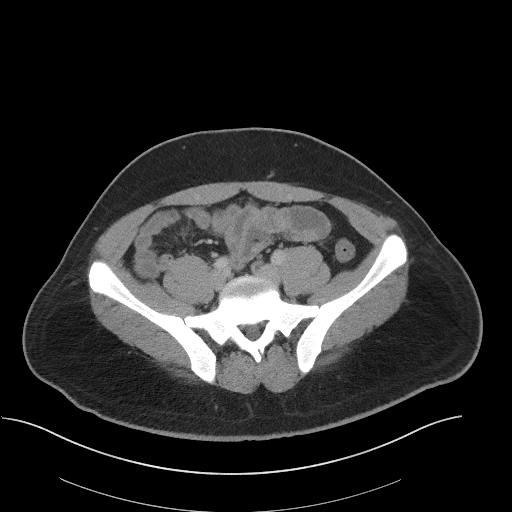
[im 59/64  soft-tissue]
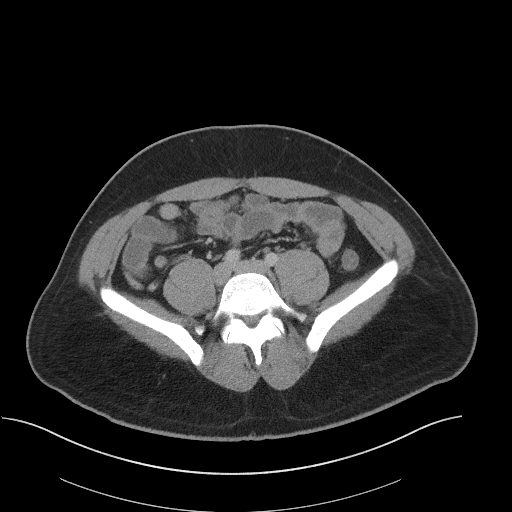

[Series 4: coronal st · coronal · 0.65mm/px · 3 of 96 slices shown]
[im 32/96  soft-tissue]
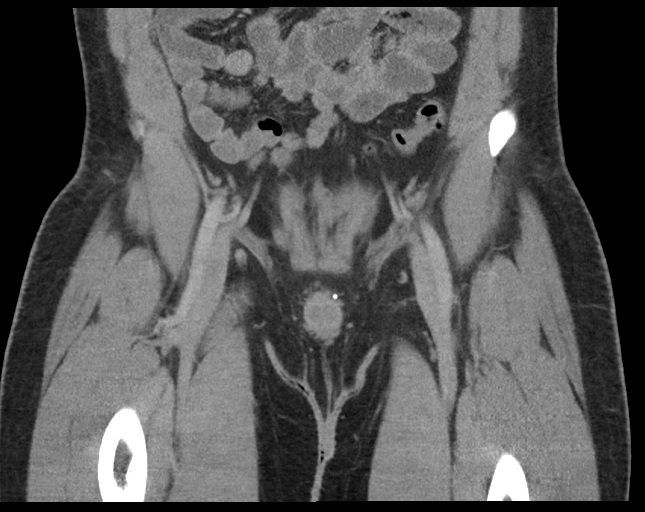
[im 43/96  soft-tissue]
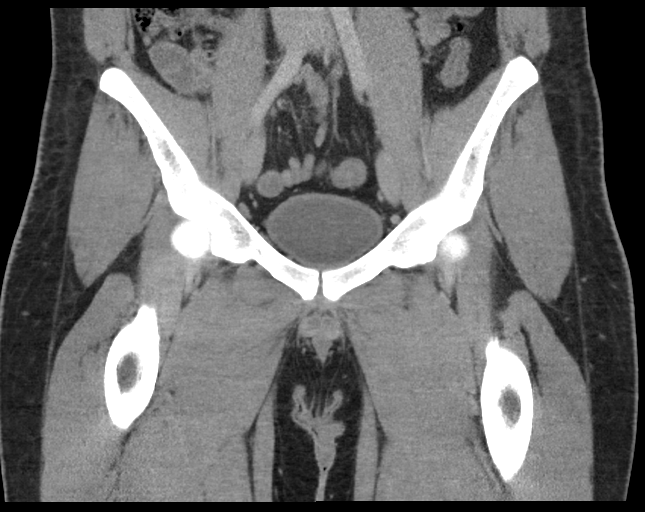
[im 53/96  soft-tissue]
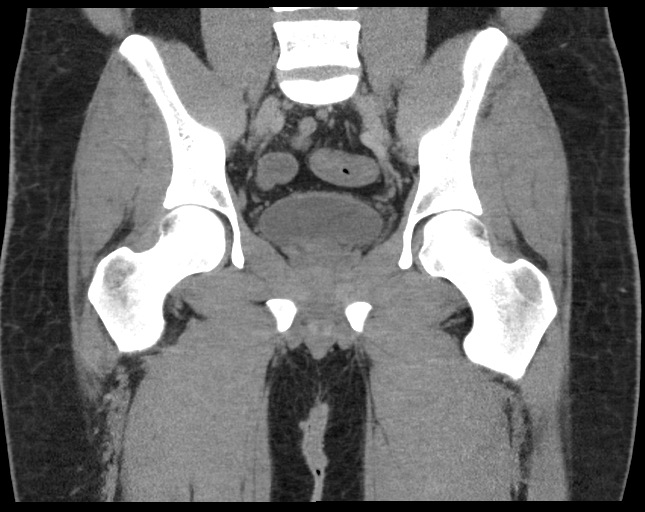

[16 of 46 positions shown; findings below may reference images not displayed]

FINDINGS: Urinary Tract:  Bladder and distal ureters normal appearance

Bowel: Normal appendix. Small bowel loops unremarkable. Colon
collapsed with suboptimal assessment of wall thickness. Suspect mild
RIGHT lateral rectal wall thickening though this too could be an
artifact related underdistention. Tiny fluid collections in the
intersphincteric space either representing fistulae or tiny abscess
collections. Surrounding fat planes clear.

Vascular/Lymphatic: Normal sized inguinal lymph nodes. No
intrapelvic adenopathy. Vascular structures unremarkable.

Reproductive:  Unremarkable

Other: No free air or free fluid. No additional inflammatory
changes.

Musculoskeletal: Unremarkable
IMPRESSION: Fluid attenuation foci within the intersphincteric space either
representing fistulae or tiny abscess collections; if clinically
indicated, extent of these can be better assessed by follow-up
pelvic MR.

## 2019-08-16 ENCOUNTER — Ambulatory Visit: Payer: Self-pay | Attending: Family

## 2019-08-16 DIAGNOSIS — Z23 Encounter for immunization: Secondary | ICD-10-CM

## 2019-08-16 NOTE — Progress Notes (Signed)
   Covid-19 Vaccination Clinic  Name:  Kenneth Cook    MRN: [id number] DOB: December 05, 1995  08/16/2019  [name] was observed post Covid-19 immunization for 15 minutes without incident. He was provided with Vaccine Information Sheet and instruction to access the V-Safe system.   [name] was instructed to call 911 with any severe reactions post vaccine: Marland Kitchen Difficulty breathing  . Swelling of face and throat  . A fast heartbeat  . A bad rash all over body  . Dizziness and weakness   Immunizations Administered    Name Date Dose VIS Date Route   Moderna COVID-19 Vaccine 08/16/2019  2:25 PM 0.5 mL 04/24/2019 Intramuscular   Manufacturer: Moderna   Lot: [id number]   NDC: 72820-601-56

## 2019-09-18 ENCOUNTER — Ambulatory Visit: Payer: Self-pay | Attending: Family

## 2019-09-18 DIAGNOSIS — Z23 Encounter for immunization: Secondary | ICD-10-CM

## 2019-09-18 NOTE — Progress Notes (Signed)
   Covid-19 Vaccination Clinic  Name:  Kenneth Cook    MRN: [id number] DOB: 24-May-1996  09/18/2019  [name] was observed post Covid-19 immunization for 15 minutes without incident. He was provided with Vaccine Information Sheet and instruction to access the V-Safe system.   [name] was instructed to call 911 with any severe reactions post vaccine: Marland Kitchen Difficulty breathing  . Swelling of face and throat  . A fast heartbeat  . A bad rash all over body  . Dizziness and weakness   Immunizations Administered    Name Date Dose VIS Date Route   Moderna COVID-19 Vaccine 09/18/2019 11:29 AM 0.5 mL 04/2019 Intramuscular   Manufacturer: Moderna   Lot: [id number]   NDC: 47533-917-92

## 2019-09-25 IMAGING — CR CHEST - 2 VIEW
2 series · 2 of 2 positions shown · non-contrast
Comparison: None.

CLINICAL DATA: Shortness of breath.

EXAM:
CHEST - 2 VIEW

[chest pa]
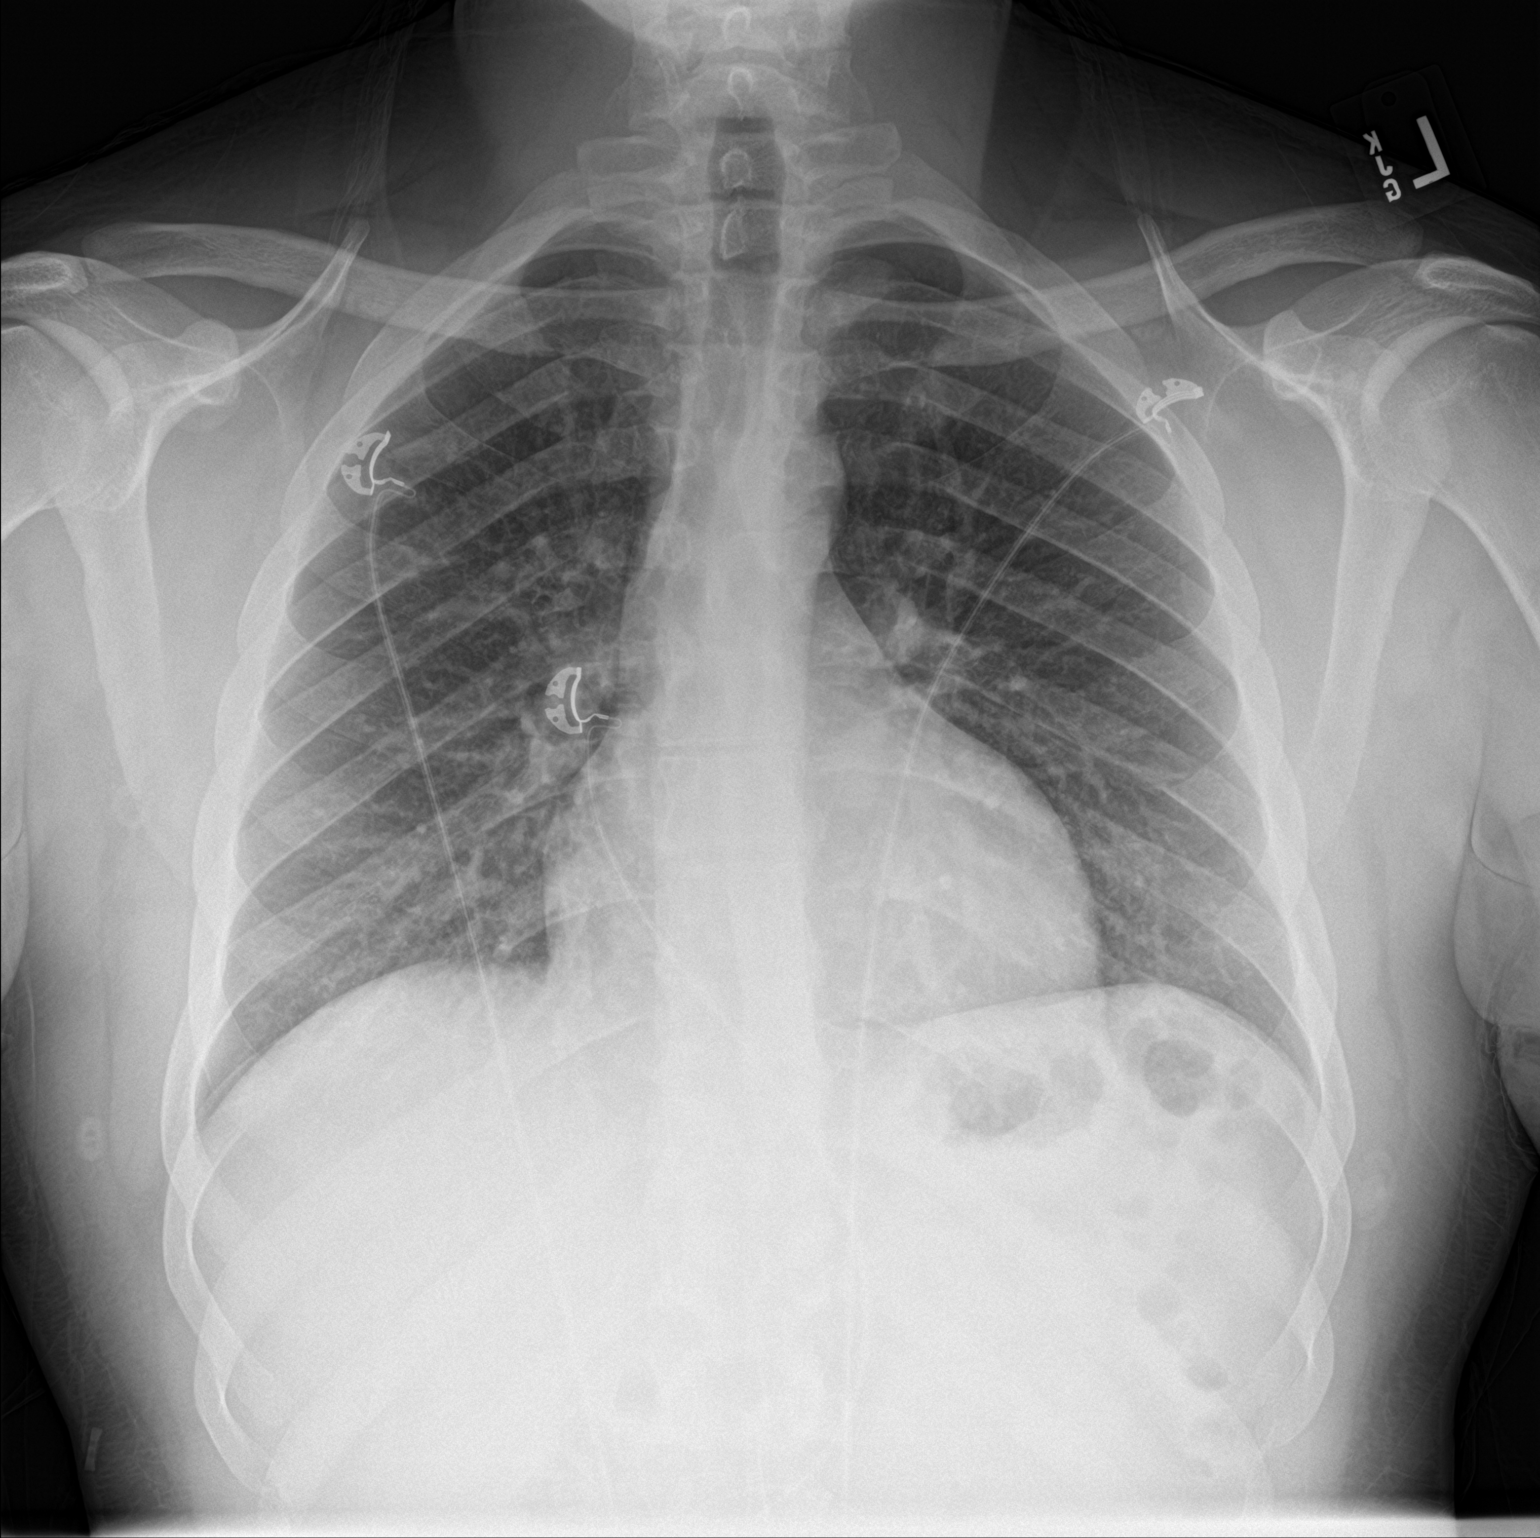

[chest lat]
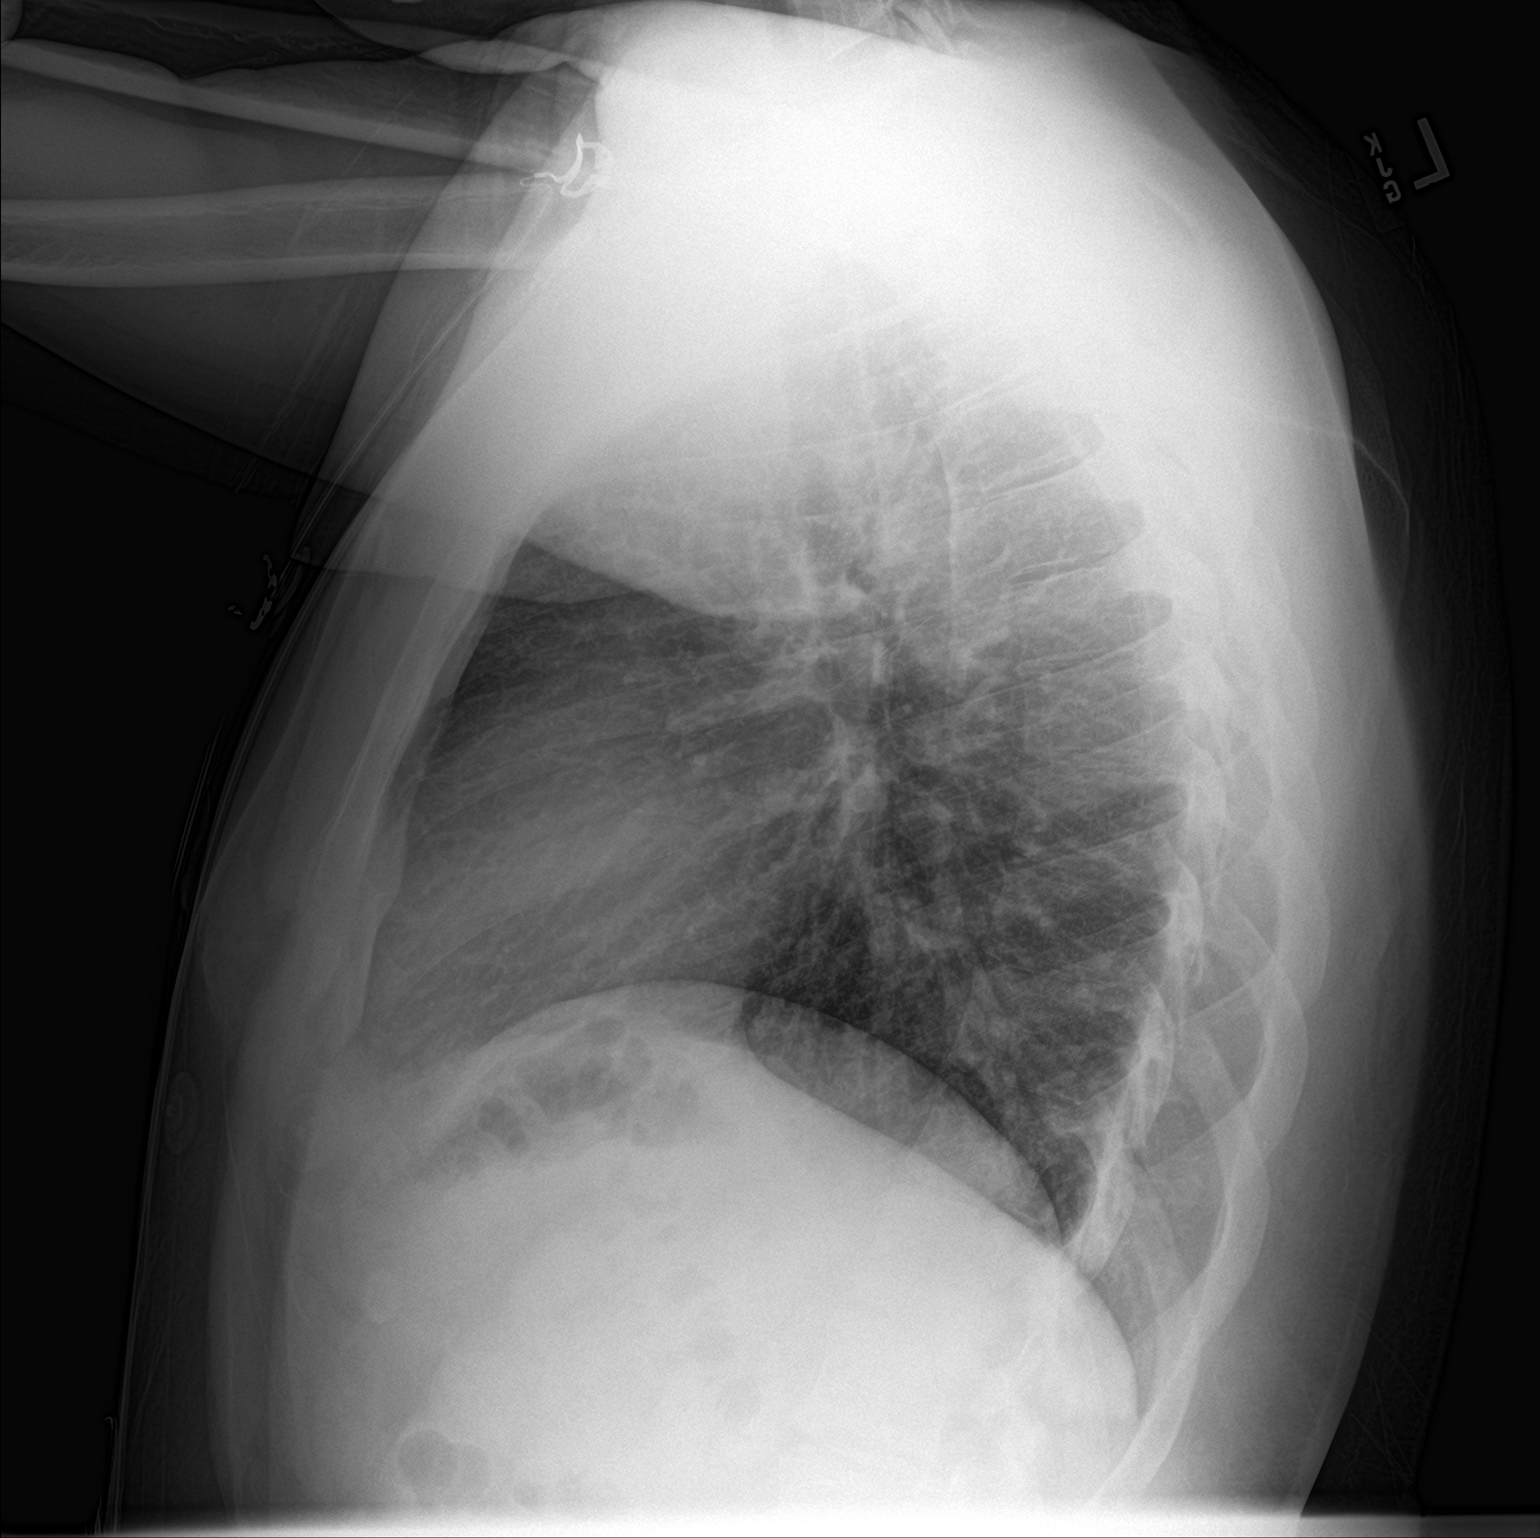

[2 of 2 positions shown; findings below may reference images not displayed]

FINDINGS: The cardiomediastinal contours are normal. Central bronchial
thickening. Pulmonary vasculature is normal. No consolidation,
pleural effusion, or pneumothorax. No acute osseous abnormalities
are seen.
IMPRESSION: Central bronchial thickening suggesting bronchitis or asthma.

## 2021-12-15 ENCOUNTER — Ambulatory Visit: Payer: Self-pay | Admitting: Nurse Practitioner

## 2022-03-25 ENCOUNTER — Other Ambulatory Visit (HOSPITAL_COMMUNITY): Payer: Self-pay

## 2022-03-25 ENCOUNTER — Ambulatory Visit (HOSPITAL_COMMUNITY)
Admission: EM | Admit: 2022-03-25 | Discharge: 2022-03-25 | Disposition: A | Discharge status: Home / Self Care | Payer: 59 | Attending: Sports Medicine | Admitting: Sports Medicine

## 2022-03-25 ENCOUNTER — Encounter (HOSPITAL_COMMUNITY): Payer: Self-pay | Admitting: *Deleted

## 2022-03-25 DIAGNOSIS — L0291 Cutaneous abscess, unspecified: Secondary | ICD-10-CM

## 2022-03-25 DIAGNOSIS — K611 Rectal abscess: Secondary | ICD-10-CM | POA: Diagnosis not present

## 2022-03-25 MED ORDER — TRAMADOL HCL 50 MG PO TABS
50.0000 mg | ORAL_TABLET | Freq: Four times a day (QID) | ORAL | 0 refills | Status: AC | PRN
  Filled 2022-03-25: qty 20, 5d supply, fill #0

## 2022-03-25 MED ORDER — AMOXICILLIN-POT CLAVULANATE 875-125 MG PO TABS
1.0000 | ORAL_TABLET | Freq: Two times a day (BID) | ORAL | 0 refills | Status: AC
  Filled 2022-03-25: qty 10, 5d supply, fill #0

## 2022-03-25 NOTE — ED Provider Notes (Signed)
[facility]    CSN: [id number] Arrival date & time: 03/25/22  9518     History   Chief Complaint Chief Complaint  Patient presents with   Abscess    HPI Kenneth Cook is a 26 y.o. male.   Today with chief complaint of a boil on his buttocks.  He reports he has had this issue multiple times in the past.  He believes he has had this same area lanced a couple years ago.  He regularly has some thin clear drainage from that site however over the past couple of days the area has become very painful and had more purulent drainage.  This makes it very difficult for him to walk and work and even sit.  He has not had a bowel movement in a couple of days as he is nervous for the pain he might have.  He denies any fevers or chills.  Does have some elevated blood pressure and this is secondary to his pain.   Abscess  Past Medical History:  Diagnosis Date   Hypertension     There are no problems to display for this patient.   History reviewed. No pertinent surgical history.     Home Medications    Prior to Admission medications   Medication Sig Start Date End Date Taking? Authorizing Provider  amLODipine (NORVASC) 5 MG tablet Take 1 tablet (5 mg total) by mouth daily. 04/06/16   Carrie Mew, MD  amoxicillin-clavulanate (AUGMENTIN) 875-125 MG tablet Take 1 tablet by mouth every 12 (twelve) hours for 5 days. 03/25/22 03/30/22 Yes Aadith Raudenbush A, DO  fluticasone (FLONASE) 50 MCG/ACT nasal spray Place 2 sprays into both nostrils daily. 09/01/18   Couture, Cortni S, PA-C  hydrochlorothiazide (HYDRODIURIL) 25 MG tablet Take 1 tablet (25 mg total) by mouth daily. 04/06/16   Carrie Mew, MD  lisinopril-hydrochlorothiazide (PRINZIDE,ZESTORETIC) 20-25 MG tablet Take 1 tablet by mouth daily for 14 days. 09/01/18 09/15/18  Couture, Cortni S, PA-C  loratadine (CLARITIN) 10 MG tablet Take 1 tablet (10 mg total) by mouth daily for 30 days. 09/01/18 10/01/18  Couture,  Cortni S, PA-C  promethazine (PHENERGAN) 25 MG tablet Take 1 tablet (25 mg total) by mouth every 6 (six) hours as needed for nausea or vomiting. 04/06/16   Carrie Mew, MD  ranitidine (ZANTAC) 150 MG capsule Take 1 capsule (150 mg total) by mouth 2 (two) times daily. 04/06/16   Carrie Mew, MD  traMADol (ULTRAM) 50 MG tablet Take 1 tablet (50 mg total) by mouth every 6 (six) hours as needed for up to 5 days. 03/25/22 03/30/22 Yes Elmore Guise, DO    Family History History reviewed. No pertinent family history.  Social History Social History   Tobacco Use   Smoking status: Former    Packs/day: 0.50    Types: Cigarettes    Quit date: 11/24/2017    Years since quitting: 4.3   Smokeless tobacco: Never  Vaping Use   Vaping Use: Never used  Substance Use Topics   Alcohol use: No   Drug use: Not Currently     Allergies   Patient has no known allergies.   Review of Systems Review of Systems as listed above in HPI   Physical Exam Triage Vital Signs ED Triage Vitals  Enc Vitals Group     BP 03/25/22 0920 (!) 168/127     Pulse Rate 03/25/22 0920 88     Resp 03/25/22 0920 18     Temp 03/25/22  0920 98.1 F (36.7 C)     Temp Source 03/25/22 0920 Oral     SpO2 03/25/22 0920 98 %     Weight --      Height --      Head Circumference --      Peak Flow --      Pain Score 03/25/22 0917 10     Pain Loc --      Pain Edu? --      Excl. in GC? --    No data found.  Updated Vital Signs BP (!) 168/127 (BP Location: Right Arm)   Pulse 88   Temp 98.1 F (36.7 C) (Oral)   Resp 18   SpO2 98%   Physical Exam Vitals reviewed.  Constitutional:      General: He is not in acute distress.    Appearance: Normal appearance. He is normal weight. He is not ill-appearing, toxic-appearing or diaphoretic.  Cardiovascular:     Rate and Rhythm: Normal rate.  Pulmonary:     Effort: Pulmonary effort is normal.  Genitourinary:    Comments: Near his rectum in the 8 o'clock  position there is a small opening that is draining purulent material.  The area surrounding is erythematous with induration. Neurological:     Mental Status: He is alert.      UC Treatments / Results  Labs (all labs ordered are listed, but only abnormal results are displayed) Labs Reviewed - No data to display  EKG   Radiology No results found.  Procedures Procedures (including critical care time)  Medications Ordered in UC Medications - No data to display  Initial Impression / Assessment and Plan / UC Course  I have reviewed the triage vital signs and the nursing notes.  Pertinent labs & imaging results that were available during my care of the patient were reviewed by me and considered in my medical decision making (see chart for details).    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  I sent antibiotics to his pharmacy, Augmentin and tramadol for pain.  I recommend he follow-up with general surgery and primary care provider.  ER precautions were reviewed.  Final Clinical Impressions(s) / UC Diagnoses   Final diagnoses:  Abscess     Discharge Instructions      Your abscess has already been draining I recommend to keep the area as clean as you can.  You can continue your topical lidocaine for relief.  I have also sent Augmentin to your pharmacy for 5 days 1 pill twice a day.  I also sent tramadol 50 mg for pain.  You may use this as needed.  MiraLAX 1-2 capfuls a day to help with bowel movements and ease pain. General surgery office numbers given.  I recommend follow-up with them for further management.  Your primary care can also help you follow-up and navigate to get to a general surgeon.  If your symptoms worsen or fail to improve return to the urgent care or see your primary care provider.    ED Prescriptions     Medication Sig Dispense Auth. Provider   amoxicillin-clavulanate (AUGMENTIN) 875-125 MG tablet Take 1 tablet by mouth every 12  (twelve) hours for 5 days. 10 tablet Gillermo Murdoch A, DO   traMADol (ULTRAM) 50 MG tablet Take 1 tablet (50 mg total) by mouth every 6 (six) hours as needed for up to 5 days. 20 tablet Gillermo Murdoch A, DO      I have  reviewed the PDMP during this encounter.   Gillermo Murdoch A, DO 03/25/22 1010

## 2022-03-25 NOTE — ED Triage Notes (Signed)
Pt states he has a abscess on his right buttocks. He has used boil ease. He has an old abscess and it leaks all the time so he would like someone to look at it.

## 2022-03-25 NOTE — Discharge Instructions (Addendum)
Your abscess has already been draining I recommend to keep the area as clean as you can.  You can continue your topical lidocaine for relief.  I have also sent Augmentin to your pharmacy for 5 days 1 pill twice a day.  I also sent tramadol 50 mg for pain.  You may use this as needed.  MiraLAX 1-2 capfuls a day to help with bowel movements and ease pain. General surgery office numbers given.  I recommend follow-up with them for further management.  Your primary care can also help you follow-up and navigate to get to a general surgeon.  If your symptoms worsen or fail to improve return to the urgent care or see your primary care provider.

## 2022-04-22 ENCOUNTER — Other Ambulatory Visit (HOSPITAL_COMMUNITY): Payer: Self-pay

## 2022-04-22 ENCOUNTER — Encounter (HOSPITAL_BASED_OUTPATIENT_CLINIC_OR_DEPARTMENT_OTHER): Payer: Self-pay | Admitting: Family Medicine

## 2022-04-22 ENCOUNTER — Ambulatory Visit (INDEPENDENT_AMBULATORY_CARE_PROVIDER_SITE_OTHER): Payer: 59 | Admitting: Family Medicine

## 2022-04-22 VITALS — BP 146/103 | HR 81 | Temp 97.6°F | Ht 72.0 in | Wt 210.8 lb

## 2022-04-22 DIAGNOSIS — L732 Hidradenitis suppurativa: Secondary | ICD-10-CM | POA: Diagnosis not present

## 2022-04-22 DIAGNOSIS — I10 Essential (primary) hypertension: Secondary | ICD-10-CM | POA: Insufficient documentation

## 2022-04-22 MED ORDER — AMLODIPINE BESYLATE 5 MG PO TABS
5.0000 mg | ORAL_TABLET | Freq: Every day | ORAL | 1 refills | Status: AC
  Filled 2022-04-22: qty 30, 30d supply, fill #0

## 2022-04-22 NOTE — Progress Notes (Signed)
New Patient Office Visit  Subjective    Patient ID: Kenneth Cook, male    DOB: 02/28/1996  Age: 26 y.o. MRN: [id number]  CC:  Chief Complaint  Patient presents with   New Patient (Initial Visit)    Pt here to establish new care    HPI Kenneth Cook presents to establish care Last PCP - has not had one for some years  HTN: Diagnosed a number of years ago with hypertension.  He has been off and on various medications in the past.  Currently not taking any medications.  Does not check his blood pressure regularly  HS: He reports history of recurrent abscesses/lesions which have occurred in the past, primarily perianally.  He did have a recent department visit earlier this month for similar issue.  At that time, he was prescribed short course of antibiotics as well as referred to general surgeon.  He is doing fairly well today in regards to this prior lesion.  Patient is originally from [place]. Patient works in [employer] for [employer]. Outside of work, he enjoys spending time with his dog, exercising, trail walking. Does eventually want to be a Emergency planning/management officer.  Outpatient Encounter Medications as of 04/22/2022  Medication Sig   amLODipine (NORVASC) 5 MG tablet Take 1 tablet (5 mg total) by mouth daily. (Patient not taking: Reported on 04/22/2022)   fluticasone (FLONASE) 50 MCG/ACT nasal spray Place 2 sprays into both nostrils daily. (Patient not taking: Reported on 04/22/2022)   hydrochlorothiazide (HYDRODIURIL) 25 MG tablet Take 1 tablet (25 mg total) by mouth daily. (Patient not taking: Reported on 04/22/2022)   loratadine (CLARITIN) 10 MG tablet Take 1 tablet (10 mg total) by mouth daily for 30 days.   promethazine (PHENERGAN) 25 MG tablet Take 1 tablet (25 mg total) by mouth every 6 (six) hours as needed for nausea or vomiting. (Patient not taking: Reported on 04/22/2022)   ranitidine (ZANTAC) 150 MG capsule Take 1 capsule (150 mg total) by mouth 2 (two) times  daily. (Patient not taking: Reported on 04/22/2022)   [DISCONTINUED] lisinopril-hydrochlorothiazide (PRINZIDE,ZESTORETIC) 20-25 MG tablet Take 1 tablet by mouth daily for 14 days.   No facility-administered encounter medications on file as of 04/22/2022.    Past Medical History:  Diagnosis Date   Hypertension     History reviewed. No pertinent surgical history.  History reviewed. No pertinent family history.  Social History   Socioeconomic History   Marital status: Single    Spouse name: Not on file   Number of children: Not on file   Years of education: Not on file   Highest education level: Not on file  Occupational History   Not on file  Tobacco Use   Smoking status: Former    Packs/day: 0.50    Types: Cigarettes    Quit date: 11/24/2017    Years since quitting: 4.4   Smokeless tobacco: Never  Vaping Use   Vaping Use: Never used  Substance and Sexual Activity   Alcohol use: No   Drug use: Not Currently   Sexual activity: Not on file  Other Topics Concern   Not on file  Social History Narrative   Not on file   Social Determinants of Health   Financial Resource Strain: Not on file  Food Insecurity: Not on file  Transportation Needs: Not on file  Physical Activity: Not on file  Stress: Not on file  Social Connections: Not on file  Intimate Partner Violence: Not on file  Objective    BP (!) 146/103 (BP Location: Left Arm, Patient Position: Sitting, Cuff Size: Large)   Pulse 81   Temp 97.6 F (36.4 C) (Oral)   Ht 6' (1.829 m)   Wt 210 lb 12.8 oz (95.6 kg)   SpO2 100%   BMI 28.59 kg/m   Physical Exam  26 year old male in no acute distress Cardiovascular exam with regular rate and rhythm, no murmur appreciated Lungs clear to auscultation bilaterally  Assessment & Plan:   Problem List Items Addressed This Visit       Cardiovascular and Mediastinum   Primary hypertension - Primary    History of hypertension, intermittently adherent to  pharmacotherapy.  Blood pressure is elevated in the office today.  Recommend proceeding with resuming pharmacotherapy, prescription for amlodipine sent to pharmacy on file.  He has utilized this in the past, no reported side effects Recommend intermittent monitoring of blood pressure, DASH diet, handout provided      Relevant Medications   amLODipine (NORVASC) 5 MG tablet     Musculoskeletal and Integument   Hidradenitis suppurativa    By history, patient appears to be dealing with hidradenitis suppurativa contributing to current painful lesions Discussed general considerations, lifestyle modifications, provided handout reviewing this.  Would be reasonable for evaluation with general surgeon which was discussed at most recent emergency department visit, recommend proceeding with this       Return in about 6 weeks (around 06/03/2022) for HTN.   Brinley Treanor J [place], MD

## 2022-04-22 NOTE — Patient Instructions (Signed)
  Medication Instructions:  Your physician recommends that you continue on your current medications as directed. Please refer to the Current Medication list given to you today. --If you need a refill on any your medications before your next appointment, please call your pharmacy first. If no refills are authorized on file call the office.-- Lab Work: Your physician has recommended that you have lab work today: No If you have labs (blood work) drawn today and your tests are completely normal, you will receive your results via MyChart message OR a phone call from our staff.  Please ensure you check your voicemail in the event that you authorized detailed messages to be left on a delegated number. If you have any lab test that is abnormal or we need to change your treatment, we will call you to review the results.  Referrals/Procedures/Imaging: No  Follow-Up: Your next appointment:   Your physician recommends that you schedule a follow-up appointment in: 6 weeks with Dr. de [place].  You will receive a text message or e-mail with a link to a survey about your care and experience with us today! We would greatly appreciate your feedback!   Thanks for letting us be apart of your health journey!!  Primary Care and Sports Medicine   Dr. Raymond de [place]   We encourage you to activate your patient portal called "MyChart".  Sign up information is provided on this After Visit Summary.  MyChart is used to connect with patients for Virtual Visits (Telemedicine).  Patients are able to view lab/test results, encounter notes, upcoming appointments, etc.  Non-urgent messages can be sent to your provider as well. To learn more about what you can do with MyChart, please visit --  [email or web address].    

## 2022-04-23 ENCOUNTER — Other Ambulatory Visit (HOSPITAL_COMMUNITY): Payer: Self-pay

## 2022-06-03 ENCOUNTER — Ambulatory Visit (HOSPITAL_BASED_OUTPATIENT_CLINIC_OR_DEPARTMENT_OTHER): Payer: Self-pay | Admitting: Family Medicine

## 2022-06-11 ENCOUNTER — Ambulatory Visit (HOSPITAL_BASED_OUTPATIENT_CLINIC_OR_DEPARTMENT_OTHER): Payer: Commercial Managed Care - PPO | Admitting: Family Medicine

## 2022-06-14 NOTE — Assessment & Plan Note (Signed)
History of hypertension, intermittently adherent to pharmacotherapy.  Blood pressure is elevated in the office today.  Recommend proceeding with resuming pharmacotherapy, prescription for amlodipine sent to pharmacy on file.  He has utilized this in the past, no reported side effects Recommend intermittent monitoring of blood pressure, DASH diet, handout provided

## 2022-06-14 NOTE — Assessment & Plan Note (Signed)
By history, patient appears to be dealing with hidradenitis suppurativa contributing to current painful lesions Discussed general considerations, lifestyle modifications, provided handout reviewing this.  Would be reasonable for evaluation with general surgeon which was discussed at most recent emergency department visit, recommend proceeding with this

## 2023-03-24 ENCOUNTER — Encounter (HOSPITAL_BASED_OUTPATIENT_CLINIC_OR_DEPARTMENT_OTHER): Payer: Self-pay | Admitting: *Deleted

## 2023-12-27 ENCOUNTER — Encounter (HOSPITAL_BASED_OUTPATIENT_CLINIC_OR_DEPARTMENT_OTHER): Admitting: Family Medicine

## 2024-05-10 ENCOUNTER — Ambulatory Visit (HOSPITAL_BASED_OUTPATIENT_CLINIC_OR_DEPARTMENT_OTHER): Admitting: Family Medicine

## 2024-06-13 ENCOUNTER — Ambulatory Visit (HOSPITAL_BASED_OUTPATIENT_CLINIC_OR_DEPARTMENT_OTHER): Admitting: Family Medicine
